# Patient Record
Sex: Male | Born: 1970 | Race: Black or African American | Hispanic: No | Marital: Single | Smoking: Never smoker
Health system: Southern US, Community
[De-identification: ages and names within clinical notes are randomized; demographics above are authoritative.]

## PROBLEM LIST (undated history)

## (undated) DIAGNOSIS — T7840XA Allergy, unspecified, initial encounter: Secondary | ICD-10-CM

## (undated) DIAGNOSIS — I10 Essential (primary) hypertension: Secondary | ICD-10-CM

## (undated) DIAGNOSIS — E119 Type 2 diabetes mellitus without complications: Secondary | ICD-10-CM

## (undated) HISTORY — PX: NO PAST SURGERIES: SHX2092

---

## 2004-01-10 ENCOUNTER — Emergency Department (HOSPITAL_COMMUNITY): Admission: EM | Admit: 2004-01-10 | Discharge: 2004-01-10 | Payer: Self-pay | Admitting: Family Medicine

## 2011-01-29 DIAGNOSIS — J302 Other seasonal allergic rhinitis: Secondary | ICD-10-CM | POA: Insufficient documentation

## 2012-12-11 ENCOUNTER — Encounter (HOSPITAL_COMMUNITY): Payer: Self-pay

## 2012-12-11 ENCOUNTER — Emergency Department (HOSPITAL_COMMUNITY)
Admission: EM | Admit: 2012-12-11 | Discharge: 2012-12-11 | Disposition: A | Discharge status: Home / Self Care | Payer: Self-pay | Attending: Emergency Medicine | Admitting: Emergency Medicine

## 2012-12-11 ENCOUNTER — Emergency Department (HOSPITAL_COMMUNITY): Payer: Self-pay

## 2012-12-11 DIAGNOSIS — S63602A Unspecified sprain of left thumb, initial encounter: Secondary | ICD-10-CM

## 2012-12-11 DIAGNOSIS — IMO0002 Reserved for concepts with insufficient information to code with codable children: Secondary | ICD-10-CM | POA: Insufficient documentation

## 2012-12-11 DIAGNOSIS — Y9389 Activity, other specified: Secondary | ICD-10-CM | POA: Insufficient documentation

## 2012-12-11 DIAGNOSIS — S6390XA Sprain of unspecified part of unspecified wrist and hand, initial encounter: Secondary | ICD-10-CM | POA: Insufficient documentation

## 2012-12-11 DIAGNOSIS — Y929 Unspecified place or not applicable: Secondary | ICD-10-CM | POA: Insufficient documentation

## 2012-12-11 HISTORY — DX: Allergy, unspecified, initial encounter: T78.40XA

## 2012-12-11 MED ORDER — HYDROCODONE-ACETAMINOPHEN 5-325 MG PO TABS: ORAL_TABLET | ORAL | Status: DC

## 2012-12-11 MED ORDER — IBUPROFEN 800 MG PO TABS: 800.0000 mg | ORAL_TABLET | Freq: Three times a day (TID) | ORAL | Status: DC

## 2012-12-11 NOTE — ED Notes (Signed)
Pt reports injuring his left thumb yesterday while weed-eating.

## 2012-12-11 NOTE — ED Notes (Signed)
Pt hit left thumb with weed eater while swatting at some bees yesterday, c/o pain mostly below thumb, swelling noted, limited ROM, ice pack provided for pt

## 2012-12-12 NOTE — ED Provider Notes (Signed)
CSN: [id number]     Arrival date & time 12/11/12  1128 History   First MD Initiated Contact with Patient 12/11/12 1146     Chief Complaint  Patient presents with  . Hand Pain   (Consider location/radiation/quality/duration/timing/severity/associated sxs/prior Treatment) Patient is a 42 y.o. male presenting with hand pain. The history is provided by the patient.  Hand Pain This is a new problem. The current episode started yesterday. The problem occurs constantly. The problem has been unchanged. Associated symptoms include arthralgias. Pertinent negatives include no chills, fever, joint swelling, neck pain, numbness, rash, vomiting or weakness. The symptoms are aggravated by bending. He has tried ice for the symptoms. The treatment provided no relief.    Past Medical History  Diagnosis Date  . Allergy    History reviewed. No pertinent past surgical history. No family history on file. History  Substance Use Topics  . Smoking status: Never Smoker   . Smokeless tobacco: Not on file  . Alcohol Use: No    Review of Systems  Constitutional: Negative for fever and chills.  HENT: Negative for neck pain.   Gastrointestinal: Negative for vomiting.  Genitourinary: Negative for dysuria and difficulty urinating.  Musculoskeletal: Positive for arthralgias. Negative for joint swelling.       Left thumb pain  Skin: Negative for color change, rash and wound.  Neurological: Negative for weakness and numbness.  All other systems reviewed and are negative.    Allergies  Review of patient's allergies indicates no known allergies.  Home Medications   Current Outpatient Rx  Name  Route  Sig  Dispense  Refill  . cetirizine (ZYRTEC) 10 MG tablet   Oral   Take 10 mg by mouth daily as needed for allergies.         . Polyvinyl Alcohol-Povidone (CLEAR EYES ALL SEASONS) 5-6 MG/ML SOLN   Ophthalmic   Apply 1 drop to eye daily as needed (Itchy Eyes).         Marland Kitchen HYDROcodone-acetaminophen  (NORCO/VICODIN) 5-325 MG per tablet      Take one-two tabs po q 4-6 hrs prn pain   12 tablet   0   . ibuprofen (ADVIL,MOTRIN) 800 MG tablet   Oral   Take 1 tablet (800 mg total) by mouth 3 (three) times daily.   21 tablet   0    BP 147/107  Pulse 99  Temp(Src) 98.7 F (37.1 C) (Oral)  Resp 20  Ht 5\' 9"  (1.753 m)  Wt 220 lb (99.791 kg)  BMI 32.47 kg/m2  SpO2 98% Physical Exam  Nursing note and vitals reviewed. Constitutional: He is oriented to person, place, and time. He appears well-developed and well-nourished. No distress.  HENT:  Head: Normocephalic and atraumatic.  Cardiovascular: Normal rate, regular rhythm and normal heart sounds.   No murmur heard. Pulmonary/Chest: Effort normal and breath sounds normal. No respiratory distress.  Musculoskeletal: He exhibits tenderness. He exhibits no edema.       Left hand: He exhibits tenderness. He exhibits normal range of motion, normal two-point discrimination, normal capillary refill, no deformity, no laceration and no swelling. Normal sensation noted. Normal strength noted.       Hands: Localized ttp of the proximal thumb and thenar eminence.    Radial pulse is brisk, distal sensation intact.  CR< 2 sec.  No bruising,edema or bony deformity.  Patient has full ROM. Wrist is NT  Neurological: He is alert and oriented to person, place, and time. He exhibits normal muscle tone.  Coordination normal.  Skin: Skin is warm and dry.    ED Course  Procedures (including critical care time) Labs Review Labs Reviewed - No data to display Imaging Review Dg Finger Thumb Left  12/11/2012   CLINICAL DATA:  Pain at 1st MCP joint, injury 1 day ago, struck hand on weed eater  EXAM: LEFT THUMB 2+V  COMPARISON:  None  FINDINGS: Tiny bony density seen at the base of the distal phalanx of the left thumb cannot exclude tiny radial margin fracture.  Joint spaces preserved.  Osseous mineralization normal.  No additional fracture, dislocation or bone  destruction.  IMPRESSION: Tiny calcific density at the radial aspect, base of distal phalanx right thumb, cannot exclude tiny fracture; correlation for pain/tenderness at this site recommended.   Electronically Signed   By: Ulyses Southward M.D.   On: 12/11/2012 12:20    MDM   1. Sprain of left thumb, initial encounter    velcro thumb spica splint applied.  Pain improved, remains NV intact.  Patient agrees to elevate, ice and referral given for orthopedics.      Elfreida Heggs L. Trisha Mangle, PA-C 12/12/12 2127

## 2012-12-14 NOTE — ED Provider Notes (Signed)
Medical screening examination/treatment/procedure(s) were performed by non-physician practitioner and as supervising physician I was immediately available for consultation/collaboration.  Lyanne Co, MD 12/14/12 [phone]

## 2013-01-08 DIAGNOSIS — E1169 Type 2 diabetes mellitus with other specified complication: Secondary | ICD-10-CM | POA: Insufficient documentation

## 2013-01-08 DIAGNOSIS — E1165 Type 2 diabetes mellitus with hyperglycemia: Secondary | ICD-10-CM | POA: Insufficient documentation

## 2018-10-22 DIAGNOSIS — R000 Tachycardia, unspecified: Secondary | ICD-10-CM | POA: Insufficient documentation

## 2018-10-22 DIAGNOSIS — N50819 Testicular pain, unspecified: Secondary | ICD-10-CM | POA: Insufficient documentation

## 2019-01-24 ENCOUNTER — Ambulatory Visit: Payer: No Typology Code available for payment source

## 2019-01-24 ENCOUNTER — Ambulatory Visit (INDEPENDENT_AMBULATORY_CARE_PROVIDER_SITE_OTHER): Payer: No Typology Code available for payment source | Admitting: Orthopedic Surgery

## 2019-01-24 ENCOUNTER — Encounter: Payer: Self-pay | Admitting: Orthopedic Surgery

## 2019-01-24 ENCOUNTER — Other Ambulatory Visit: Payer: Self-pay

## 2019-01-24 VITALS — BP 158/98 | HR 98 | Ht 69.0 in | Wt 155.0 lb

## 2019-01-24 DIAGNOSIS — M79642 Pain in left hand: Secondary | ICD-10-CM | POA: Diagnosis not present

## 2019-01-24 DIAGNOSIS — E1159 Type 2 diabetes mellitus with other circulatory complications: Secondary | ICD-10-CM | POA: Insufficient documentation

## 2019-01-24 NOTE — Patient Instructions (Signed)
Start OT    

## 2019-01-24 NOTE — Progress Notes (Signed)
Zachary Mayo  01/24/2019  Body mass index is 22.89 kg/m.   HISTORY SECTION :  Chief Complaint  Patient presents with  . Hand Pain    left / injury one week ago after lifting well cap    48 1 week ago dropped a well cover on his fingers he had a dislocation of his ring and long finger on his left hand he popped it back in place but after a week he noticed that he was still having trouble bending his fingers comes in with only mild discomfort PIP joint left ring and long finger with decreased range of motion minimal swelling   Review of Systems  All other systems reviewed and are negative.    has a past medical history of Allergy.   History reviewed. No pertinent surgical history.  Body mass index is 22.89 kg/m.   No Known Allergies   Current Outpatient Medications:  .  atenolol (TENORMIN) 25 MG tablet, Take by mouth., Disp: , Rfl:  .  Insulin Glargine, 1 Unit Dial, (TOUJEO SOLOSTAR) 300 UNIT/ML SOPN, Inject into the skin., Disp: , Rfl:  .  metFORMIN (GLUCOPHAGE-XR) 500 MG 24 hr tablet, Take by mouth., Disp: , Rfl:  .  cetirizine (ZYRTEC) 10 MG tablet, Take 10 mg by mouth daily as needed for allergies., Disp: , Rfl:    PHYSICAL EXAM SECTION: 1) BP (!) 158/98   Pulse 98   Ht 5\' 9"  (1.753 m)   Wt 155 lb (70.3 kg)   BMI 22.89 kg/m   Body mass index is 22.89 kg/m. General appearance: Well-developed well-nourished no gross deformities  2) Cardiovascular normal pulse and perfusion in the upper extremities normal color without edema  3) Neurologically deep tendon reflexes are equal and normal, no sensation loss or deficits no pathologic reflexes  4) Psychological: Awake alert and oriented x3 mood and affect normal  5) Skin no lacerations or ulcerations no nodularity no palpable masses, no erythema or nodularity  6) Musculoskeletal:   Right hand looks normal  Left hand both joints are aligned normally he has tenderness at the PIP joints of the ring and long finger  he cannot really bend him very much but his flexor tendon function is intact at the DIP and PIP joint both joints are stable and the skin is intact   MEDICAL DECISION SECTION:  Encounter Diagnosis  Name Primary?  . Left hand pain Yes    Imaging X-rays were taken in the office we did not see a fracture  Plan:  (Rx., Inj., surg., Frx, MRI/CT, XR:2)  Active range of motion active assisted range of motion and physical/Occupational Therapy  Follow-up 4 to 6 weeks  10:08 AM Arther Abbott, MD  01/24/2019

## 2019-01-31 ENCOUNTER — Other Ambulatory Visit: Payer: Self-pay

## 2019-01-31 ENCOUNTER — Ambulatory Visit (HOSPITAL_COMMUNITY): Payer: PRIVATE HEALTH INSURANCE | Attending: Orthopedic Surgery

## 2019-01-31 DIAGNOSIS — M25542 Pain in joints of left hand: Secondary | ICD-10-CM | POA: Insufficient documentation

## 2019-01-31 DIAGNOSIS — M25642 Stiffness of left hand, not elsewhere classified: Secondary | ICD-10-CM | POA: Insufficient documentation

## 2019-01-31 DIAGNOSIS — R29898 Other symptoms and signs involving the musculoskeletal system: Secondary | ICD-10-CM | POA: Diagnosis present

## 2019-01-31 NOTE — Patient Instructions (Signed)
AROM: DIP Flexion / Extension   Pinch middle knuckle of ___middle and finger_____ finger of right hand to prevent bending. Bend end knuckle until stretch is felt. Hold _5___ seconds. Relax. Straighten finger as far as possible. Repeat _10___ times per set. Do __1__ sets per session. Do __2-3__ sessions per day.  Copyright  VHI. All rights reserved.    AROM: PIP Flexion / Extension   Pinch bottom knuckle of ___middle and finger_____ finger of right hand to prevent bending. Actively bend middle knuckle until stretch is felt. Hold ___5_ seconds. Relax. Straighten finger as far as possible. Repeat _10___ times per set. Do __1__ sets per session. Do __2-3__ sessions per day.  Copyright  VHI. All rights reserved.   AROM: Finger Flexion / Extension   Actively bend fingers of right hand. Start with knuckles furthest from palm, and slowly make a fist. Hold _5___ seconds. Relax. Then straighten fingers as far as possible. Repeat _10__ times per set. Do __1__ sets per session. Do __2-3_ sessions per day.   Tendon Gliding Exercises: Complete 10X, hold for 3-5 seconds, 1-2x per day  1) Straight: begin with wrist in extended position and fingers straight      2) Hook: Bend your fingers making them look like a hook while keeping your thumb straight.      3) Fist: Make your hand into a fist.      4) Table Top: Straighten your fingers straight out making them look like a table top.      5) Straight Fist: Bend your fingers straight down into a straight fist.    Home Exercises Program Theraputty Exercises  Do the following exercises 1 times a day using your affected hand.    1. Roll putty into a roll.  2. Pinch along log with first finger and thumb.   3. Roll it back into a log.   4. Pinch using thumb and side of first finger.  5. Roll into a ball, then flatten into a pancake.  6. Using your fingers, make putty into a mountain.  7. Roll the putty into a ball;  squeeze/release 10-15 times.

## 2019-02-03 NOTE — Therapy (Signed)
[facility] [address] [place], [place], [address] Phone: [phone]   Fax:  [phone]  Occupational Therapy Evaluation  Patient Details  Name: Zachary Mayo MRN: [id number] Date of Birth: [birth year] Referring Provider (OT): Fuller Canada, MD   Encounter Date: 01/31/2019  OT End of Session - 02/03/19 1339    Visit Number  1    Number of Visits  1    Authorization Type  [employer]    Authorization Time Period  no visit limit    OT Start Time  1030    OT Stop Time  1102    OT Time Calculation (min)  32 min    Activity Tolerance  Patient tolerated treatment well    Behavior During Therapy  [facility] for tasks assessed/performed       Past Medical History:  Diagnosis Date  . Allergy     No past surgical history on file.  There were no vitals filed for this visit.        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   Mobility  Mobility Status Independent  Written Expression  Dominant Hand Right  Vision - History  Baseline Vision No visual deficits  Cognition  Overall Cognitive Status Within Functional Limits for tasks assessed  Observation/Other Assessments  Focus on Therapeutic Outcomes (FOTO)  N/A  Coordination  9 Hole Peg Test  Right;Left  Right 9 Hole Peg Test 20.4"  Left 9 Hole Peg Test 23.4"  Edema  Edema Right hand between PIP and DIP jointL 15.5 cm. left: 15.75 cm  ROM / Strength  AROM / PROM / Strength AROM;PROM;Strength  Strength  Strength Assessment Site Hand  Right/Left hand Right;Left  Right Hand Grip (lbs) 110  Right Hand Lateral Pinch 20 lbs  Right Hand 3 Point Pinch 19 lbs  Left Hand Grip (lbs) 48  Left Hand Lateral Pinch 20 lbs  Left Hand 3 Point Pinch 18 lbs  Left Hand AROM  L Long  MCP 0-90 90 Degrees  L Long PIP 0-100 92 Degrees  L Long DIP 0-70 56 Degrees  L Ring  MCP 0-90 84 Degrees  L Ring PIP 0-100 82 Degrees  L Ring DIP 0-70 40 Degrees                   OT Education - 02/03/19 1335    Education Details  theraputty for grip and pinch strengthening (yellow and red), tendon gliding, A/ROM for the hand.    Person(s) Educated  Patient    Methods  Explanation;Demonstration;Verbal cues;Handout    Comprehension  Returned demonstration;Verbalized understanding       OT Short Term Goals - 02/03/19 1355      OT SHORT TERM GOAL #1   Title  Patient will be educated and independent with HEP in order to return to  using his left hand has his non dominant for all daily and work related tasks with less difficulty and increased comfort.    Time  1    Period  Days    Status  Achieved    Target Date  01/31/19               Plan - 02/03/19 1352    Clinical Impression Statement  A: Patient is a 48 y/o male S/P left hand ring and long finger dislocation causing increased pain with gripping, edema, and decreased strength and ROM resulting in some difficulty utilizing his left hand as his non dominant extremity for all daily tasks. Pt reports that since he saw the MD, he has been actively working on stretching his finger into flexion and his ROM as increased. His deficits are minimal enough that he feels comfortable completing a HEP at home to focus on the mentioned deficits.  Reviewed HEP and all recommendations for edema reduction and pain management. Pt is in agreement with plan.    OT Occupational Profile and History  Problem Focused Assessment - Including review of records relating to presenting problem    Occupational performance deficits (Please refer to evaluation for details):  ADL's    Rehab Potential  Excellent    Clinical Decision Making  Limited treatment options, no task modification necessary    Comorbidities Affecting Occupational Performance:  None    Modification or Assistance to Complete Evaluation   No modification of tasks or assist necessary to complete eval    OT Frequency  One time visit    OT Treatment/Interventions  Patient/family education    Plan  P: One time visit to establish HEP. Pt to follow up with MD.    Consulted and Agree with Plan of Care  Patient       Patient will benefit from skilled therapeutic intervention in order to improve the following deficits and impairments:           Visit Diagnosis: Stiffness of left hand, not elsewhere classified - Plan: Ot plan of care cert/re-cert  Pain in joint of left hand - Plan: Ot plan of care cert/re-cert  Other symptoms and signs involving the musculoskeletal system - Plan: Ot plan of care cert/re-cert    Problem List Patient Active Problem List   Diagnosis Date Noted  . Hypertension associated with diabetes ([place]) 01/24/2019  . Tachycardia 10/22/2018  . Testicular pain 10/22/2018  . Dyslipidemia associated with type 2 diabetes mellitus ([place]) 01/08/2013  . Uncontrolled type 2 diabetes mellitus with hyperglycemia, with long-term current use of insulin ([place]) 01/08/2013  . Seasonal allergies 01/29/2011   Ailene Ravel, OTR/L,CBIS  [phone]  02/03/2019, 1:58 PM  [facility] [address] [place], [place], [address] Phone: [phone]   Fax:  [phone]  Name: Zachary Mayo MRN: [id number] Date of Birth: 11/16/1970

## 2019-02-11 ENCOUNTER — Encounter (HOSPITAL_COMMUNITY): Payer: No Typology Code available for payment source

## 2019-02-13 ENCOUNTER — Encounter (HOSPITAL_COMMUNITY): Payer: No Typology Code available for payment source

## 2019-02-14 ENCOUNTER — Other Ambulatory Visit: Payer: Self-pay

## 2019-02-14 ENCOUNTER — Ambulatory Visit (INDEPENDENT_AMBULATORY_CARE_PROVIDER_SITE_OTHER): Payer: No Typology Code available for payment source | Admitting: Orthopedic Surgery

## 2019-02-14 ENCOUNTER — Encounter: Payer: Self-pay | Admitting: Orthopedic Surgery

## 2019-02-14 VITALS — BP 123/87 | HR 104 | Temp 97.4°F | Ht 69.0 in | Wt 154.4 lb

## 2019-02-14 DIAGNOSIS — S63289D Dislocation of proximal interphalangeal joint of unspecified finger, subsequent encounter: Secondary | ICD-10-CM | POA: Diagnosis not present

## 2019-02-14 NOTE — Progress Notes (Signed)
Chief Complaint  Patient presents with  . Hand Pain    L/ring finger is alittle better, stiff at times, some soreness but can more it alittle more now.    48 year old male injured his left ring and long finger dropping a well cover on them dislocating them but he relocated them and then came in for follow-up.  Injury date was November 6  He is regained almost all of his motion has some swelling of the PIP joint and a little flexion contracture there but is passively correctable  He is advised to continue his exercises work on extension expect some swelling at the PIP joint for up to 6 months  Follow-up if he continues to have any problems  Encounter Diagnosis  Name Primary?  . Dislocation of proximal interphalangeal joint of finger, subsequent encounter Yes

## 2019-02-17 ENCOUNTER — Encounter (HOSPITAL_COMMUNITY): Payer: No Typology Code available for payment source

## 2019-02-20 ENCOUNTER — Encounter (HOSPITAL_COMMUNITY): Payer: No Typology Code available for payment source

## 2019-02-25 ENCOUNTER — Encounter (HOSPITAL_COMMUNITY): Payer: No Typology Code available for payment source

## 2019-02-27 ENCOUNTER — Encounter (HOSPITAL_COMMUNITY): Payer: No Typology Code available for payment source

## 2019-03-03 ENCOUNTER — Encounter (HOSPITAL_COMMUNITY): Payer: No Typology Code available for payment source | Admitting: Occupational Therapy

## 2019-03-05 ENCOUNTER — Encounter (HOSPITAL_COMMUNITY): Payer: No Typology Code available for payment source | Admitting: Occupational Therapy

## 2019-03-11 ENCOUNTER — Encounter (HOSPITAL_COMMUNITY): Payer: No Typology Code available for payment source

## 2019-03-13 ENCOUNTER — Encounter (HOSPITAL_COMMUNITY): Payer: No Typology Code available for payment source | Admitting: Occupational Therapy

## 2019-09-09 ENCOUNTER — Ambulatory Visit
Admission: EM | Admit: 2019-09-09 | Discharge: 2019-09-09 | Disposition: A | Discharge status: Home / Self Care | Payer: PRIVATE HEALTH INSURANCE | Attending: Emergency Medicine | Admitting: Emergency Medicine

## 2019-09-09 DIAGNOSIS — R000 Tachycardia, unspecified: Secondary | ICD-10-CM | POA: Diagnosis not present

## 2019-09-09 DIAGNOSIS — S0501XA Injury of conjunctiva and corneal abrasion without foreign body, right eye, initial encounter: Secondary | ICD-10-CM

## 2019-09-09 DIAGNOSIS — R030 Elevated blood-pressure reading, without diagnosis of hypertension: Secondary | ICD-10-CM

## 2019-09-09 HISTORY — DX: Type 2 diabetes mellitus without complications: E11.9

## 2019-09-09 MED ORDER — POLYMYXIN B-TRIMETHOPRIM 10000-0.1 UNIT/ML-% OP SOLN: 1.0000 [drp] | Freq: Four times a day (QID) | OPHTHALMIC | 0 refills | Status: DC

## 2019-09-09 MED ORDER — POLYMYXIN B-TRIMETHOPRIM 10000-0.1 UNIT/ML-% OP SOLN: 1.0000 [drp] | Freq: Four times a day (QID) | OPHTHALMIC | 0 refills | Status: AC

## 2019-09-09 NOTE — ED Provider Notes (Signed)
Zachary Mayo    CSN: [id number] Arrival date & time: 09/09/19  1324      History   Chief Complaint Chief Complaint  Patient presents with  . Eye Problem    HPI Zachary Mayo is a 49 y.o. male.   Patient presents with right eye tearing, irritation, and mild itching since 09/05/2019.  He states the symptoms began after he got his haircut and his barber brushed his face to remove the hair; he thinks the brush hit his eye.  He denies acute eye pain or changes in his vision.  He denies fever, chills, or other symptoms.  Patient states his heart rate is elevated when he goes to the doctor.  He denies chest pain, shortness of breath, weakness, numbness, headache, dizziness or other symptoms.  The history is provided by the patient.    Past Medical History:  Diagnosis Date  . Allergy   . Diabetes mellitus without complication (HCC)    type 2    Patient Active Problem List   Diagnosis Date Noted  . Hypertension associated with diabetes (H[facility]) 01/24/2019  . Tachycardia 10/22/2018  . Testicular pain 10/22/2018  . Dyslipidemia associated with type 2 diabetes mellitus (H[facility]) 01/08/2013  . Uncontrolled type 2 diabetes mellitus with hyperglycemia, with long-term current use of insulin (H[facility]) 01/08/2013  . Seasonal allergies 01/29/2011    Past Surgical History:  Procedure Laterality Date  . NO PAST SURGERIES         Home Medications    Prior to Admission medications   Medication Sig Start Date End Date Taking? Authorizing Provider  atenolol (TENORMIN) 25 MG tablet Take by mouth. 01/17/19   [provider]  cetirizine (ZYRTEC) 10 MG tablet Take 10 mg by mouth daily as needed for allergies.    [provider]  Insulin Glargine, 1 Unit Dial, (TOUJEO SOLOSTAR) 300 UNIT/ML SOPN Inject into the skin. 10/16/18   [provider]  metFORMIN (GLUCOPHAGE-XR) 500 MG 24 hr tablet Take by mouth. 10/06/18   [provider]  trimethoprim-polymyxin b  (POLYTRIM) ophthalmic solution Place 1 drop into both eyes 4 (four) times daily for 7 days. 09/09/19 09/16/19  Mickie Bail, NP    Family History Family History  Problem Relation Age of Onset  . Diabetes Mother   . Diabetes Father   . Diabetes Maternal Grandmother   . Diabetes Maternal Grandfather   . Diabetes Paternal Grandmother   . Diabetes Paternal Grandfather     Social History Social History   Tobacco Use  . Smoking status: Never Smoker  . Smokeless tobacco: Never Used  Vaping Use  . Vaping Use: Never used  Substance Use Topics  . Alcohol use: No  . Drug use: No     Allergies   Patient has no known allergies.   Review of Systems Review of Systems  Constitutional: Negative for chills and fever.  HENT: Negative for ear pain and sore throat.   Eyes: Positive for discharge, redness and itching. Negative for pain and visual disturbance.  Respiratory: Negative for cough and shortness of breath.   Cardiovascular: Negative for chest pain and palpitations.  Gastrointestinal: Negative for abdominal pain and vomiting.  Genitourinary: Negative for dysuria and hematuria.  Musculoskeletal: Negative for arthralgias and back pain.  Skin: Negative for color change and rash.  Neurological: Negative for seizures and syncope.  All other systems reviewed and are negative.    Physical Exam Triage Vital Signs ED Triage Vitals [09/09/19 1325]  Enc  Vitals Group     BP      Pulse      Resp      Temp      Temp src      SpO2      Weight      Height      Head Circumference      Peak Flow      Pain Score 0     Pain Loc      Pain Edu?      Excl. in GC?    No data found.  Updated Vital Signs BP (!) 139/96   Pulse (!) 142   Temp 99.5 F (37.5 C)   Resp 14   SpO2 98%   Visual Acuity Right Eye Distance: 20/20 Left Eye Distance: 20/20 Bilateral Distance: 20/30  Right Eye Near:   Left Eye Near:    Bilateral Near:     Physical Exam Vitals and nursing note  reviewed.  Constitutional:      General: He is not in acute distress.    Appearance: He is well-developed. He is not ill-appearing.  HENT:     Head: Normocephalic and atraumatic.     Mouth/Throat:     Mouth: Mucous membranes are moist.  Eyes:     General: Lids are normal. Vision grossly intact. No visual field deficit.    Extraocular Movements: Extraocular movements intact.     Conjunctiva/sclera:     Right eye: Right conjunctiva is injected.     Left eye: Left conjunctiva is not injected.     Pupils: Pupils are equal, round, and reactive to light.     Right eye: Fluorescein uptake present.   Cardiovascular:     Rate and Rhythm: Normal rate and regular rhythm.     Heart sounds: Normal heart sounds. No murmur heard.   Pulmonary:     Effort: Pulmonary effort is normal. No respiratory distress.     Breath sounds: Normal breath sounds. No wheezing or rhonchi.  Abdominal:     Palpations: Abdomen is soft.     Tenderness: There is no abdominal tenderness.  Musculoskeletal:     Cervical back: Neck supple.     Right lower leg: No edema.     Left lower leg: No edema.  Skin:    General: Skin is warm and dry.     Findings: No rash.  Neurological:     General: No focal deficit present.     Mental Status: He is alert and oriented to person, place, and time.     Gait: Gait normal.  Psychiatric:        Mood and Affect: Mood normal.        Behavior: Behavior normal.      UC Treatments / Results  Labs (all labs ordered are listed, but only abnormal results are displayed) Labs Reviewed - No data to display  EKG   Radiology No results found.  Procedures Procedures (including critical care time)  Medications Ordered in UC Medications - No data to display  Initial Impression / Assessment and Plan / UC Course  I have reviewed the triage vital signs and the nursing notes.  Pertinent labs & imaging results that were available during my care of the patient were reviewed by me  and considered in my medical decision making (see chart for details).   Right corneal abrasion.  Tachycardia, elevated blood pressure with known hypertension.  Treating with Polytrim eyedrops.  Instructed patient to follow-up with  his eye doctor in 1 to 2 days for recheck.  Instructed him to go to the emergency department if he has acute eye pain or changes in his vision.  Discussed with patient that his heart rate and blood pressure are elevated today.  Instructed him to go to the emergency department if he has chest pain, shortness of breath, weakness, or other concerning symptoms.  Instructed him to follow-up with his PCP in 2 to 4 weeks for a recheck.  Patient agrees to plan of care.      Final Clinical Impressions(s) / UC Diagnoses   Final diagnoses:  Abrasion of right cornea, initial encounter  Tachycardia  Elevated blood pressure reading     Discharge Instructions     Use the antibiotic eyedrops as prescribed.    Follow-up with your eye doctor for a recheck in 1 to 2 days if your symptoms are not improving.    Go to the emergency department if you have acute eye pain or changes in your vision.    Your blood pressure is elevated today at 139/96; heart rate 138.  Please have this rechecked by your primary care provider in 2-4 weeks.            ED Prescriptions    Medication Sig Dispense Auth. Provider   trimethoprim-polymyxin b (POLYTRIM) ophthalmic solution  (Status: Discontinued) Place 1 drop into both eyes 4 (four) times daily for 7 days. 10 mL Mickie Bail, NP   trimethoprim-polymyxin b (POLYTRIM) ophthalmic solution Place 1 drop into both eyes 4 (four) times daily for 7 days. 10 mL Mickie Bail, NP     PDMP not reviewed this encounter.   Mickie Bail, NP 09/09/19 [phone]

## 2019-09-09 NOTE — Discharge Instructions (Addendum)
Use the antibiotic eyedrops as prescribed.    Follow-up with your eye doctor for a recheck in 1 to 2 days if your symptoms are not improving.    Go to the emergency department if you have acute eye pain or changes in your vision.    Your blood pressure is elevated today at 139/96; heart rate 138.  Please have this rechecked by your primary care provider in 2-4 weeks.

## 2019-09-09 NOTE — ED Triage Notes (Signed)
Patient complains of right eye irritation since Friday. Denies sensitivity to light, pain, vision changes or disturbances but endorses excessive tear production.

## 2019-12-25 ENCOUNTER — Other Ambulatory Visit: Payer: Self-pay

## 2019-12-25 ENCOUNTER — Encounter: Payer: Self-pay | Admitting: Family Medicine

## 2019-12-25 ENCOUNTER — Ambulatory Visit: Payer: Self-pay

## 2019-12-25 ENCOUNTER — Ambulatory Visit (INDEPENDENT_AMBULATORY_CARE_PROVIDER_SITE_OTHER): Payer: No Typology Code available for payment source | Admitting: Family Medicine

## 2019-12-25 DIAGNOSIS — M25561 Pain in right knee: Secondary | ICD-10-CM | POA: Diagnosis not present

## 2019-12-25 MED ORDER — DICLOFENAC SODIUM 1 % EX GEL: 4.0000 g | Freq: Four times a day (QID) | CUTANEOUS | 6 refills | Status: DC | PRN

## 2019-12-25 MED ORDER — MELOXICAM 15 MG PO TABS: 7.5000 mg | ORAL_TABLET | Freq: Every day | ORAL | 6 refills | Status: DC | PRN

## 2019-12-25 NOTE — Progress Notes (Signed)
Office Visit Note   Patient: Zachary Mayo           Date of Birth: 06-26-1970           MRN: [id number] Visit Date: 12/25/2019 Requested by: Wilfred Curtis, MD [address] [place],  [place] [address] PCP: Wilfred Curtis, MD  Subjective: Chief Complaint  Patient presents with  . Right Knee - Pain    Knee popped while casually walking in the mall 12/20/19 - pain/tightness all around the patella since then. Has not noticed swelling. Hurts to straighten or bend knee much. H/o injury 20-30 years ago to that knee.    HPI: He is here with right knee pain.  On October 9 he was walking in the mall straight ahead, and suddenly his knee popped.  It was audible and extremely painful.  He has had some pain and swelling below the kneecap since then, and a sensation of tightness.  He has difficulty fully extending the knee and flexing it.  It has not locked.  He has a history of a knee injury 20 or 30 years ago that healed without trouble, it was a contusion to the knee while sliding into a base.  He has never had pain quite like this.  He has tried over-the-counter NSAIDs with minimal improvement.              ROS: No fevers or chills.  All other systems were reviewed and are negative.  Objective: Vital Signs: There were no vitals taken for this visit.  Physical Exam:  General:  Alert and oriented, in no acute distress. Pulm:  Breathing unlabored. Psy:  Normal mood, congruent affect. Skin: No erythema Right knee: No joint effusion.  He has no patellofemoral crepitus, but is unable to fully extend his knee today because of pain.  He has some pain with patellar apprehension and he is very tender over the distal medial patella and proximal patellar tendon, especially in the fat pad behind the tendon.  No laxity with varus or valgus stress and no pain.  There is not a lot of medial joint line tenderness, and no palpable click with McMurray's.  Lachman's feels solid.  Imaging: XR Knee 1-2 Views  Right  Result Date: 12/25/2019 X-rays of the right knee reveal well-preserved joint space, no sign of loose body or fracture.  No acute abnormality.   Assessment & Plan: 1.  Acute right knee pain, possibilities include contusion of Hoffa's fat pad, or patella subluxation. -We will try a PSO brace, Voltaren gel topically and meloxicam by mouth.  If symptoms are not improving in a couple weeks, then either physical therapy or MRI scan.     Procedures: No procedures performed  No notes on file     PMFS History: Patient Active Problem List   Diagnosis Date Noted  . Hypertension associated with diabetes (HCC) 01/24/2019  . Tachycardia 10/22/2018  . Testicular pain 10/22/2018  . Dyslipidemia associated with type 2 diabetes mellitus (HCC) 01/08/2013  . Uncontrolled type 2 diabetes mellitus with hyperglycemia, with long-term current use of insulin (HCC) 01/08/2013  . Seasonal allergies 01/29/2011   Past Medical History:  Diagnosis Date  . Allergy   . Diabetes mellitus without complication (HCC)    type 2    Family History  Problem Relation Age of Onset  . Diabetes Mother   . Diabetes Father   . Diabetes Maternal Grandmother   . Diabetes Maternal Grandfather   . Diabetes Paternal Grandmother   .  Diabetes Paternal Grandfather     Past Surgical History:  Procedure Laterality Date  . NO PAST SURGERIES     Social History   Occupational History  . Not on file  Tobacco Use  . Smoking status: Never Smoker  . Smokeless tobacco: Never Used  Vaping Use  . Vaping Use: Never used  Substance and Sexual Activity  . Alcohol use: No  . Drug use: No  . Sexual activity: Never

## 2019-12-25 NOTE — Patient Instructions (Signed)
    Infrapatellar fat pad irritation.  Possible patella subluxation.  Do leg raises to keep strong.  Do partial knee bends to prevent stiffness.    Brace during activity if it feels better to wear it.  Voltaren gel topically; meloxicam by mouth.  Call for either physical therapy referral or for MRI scan in 2-3 weeks if not improving.

## 2020-01-01 ENCOUNTER — Telehealth: Payer: Self-pay | Admitting: *Deleted

## 2020-01-01 DIAGNOSIS — M25561 Pain in right knee: Secondary | ICD-10-CM

## 2020-01-01 NOTE — Telephone Encounter (Signed)
Pt called stating he was in office couple weeks ago and was under the impression he was going to get MRI scheduled, but hasnt heard from anyone. I looked up the plan in last ov notes and saw that if he was not any better we can try PT or MRI. Pt states he wants to go ahead and proceed with the MRI. PLease advise

## 2020-01-02 NOTE — Telephone Encounter (Signed)
MRI ordered.  Sometimes insurance doesn't approve without doing more conservative treatment.

## 2020-01-02 NOTE — Telephone Encounter (Signed)
Knee still hurting.  Will order MRI.

## 2020-01-24 ENCOUNTER — Other Ambulatory Visit: Payer: Self-pay

## 2020-01-24 ENCOUNTER — Ambulatory Visit
Admission: RE | Admit: 2020-01-24 | Discharge: 2020-01-24 | Disposition: A | Discharge status: Home / Self Care | Payer: No Typology Code available for payment source | Source: Ambulatory Visit | Attending: Family Medicine | Admitting: Family Medicine

## 2020-01-24 DIAGNOSIS — M25561 Pain in right knee: Secondary | ICD-10-CM

## 2020-01-26 ENCOUNTER — Telehealth: Payer: Self-pay | Admitting: Family Medicine

## 2020-01-26 DIAGNOSIS — M25561 Pain in right knee: Secondary | ICD-10-CM

## 2020-01-26 NOTE — Telephone Encounter (Signed)
MRI looks good, no sign of ligament or cartilage injury.  I will request referral to physical therapy to [place] location. 

## 2020-01-26 NOTE — Telephone Encounter (Signed)
MRI looks good, no sign of ligament or cartilage injury.  I will request referral to physical therapy to [place] location.

## 2020-02-12 ENCOUNTER — Other Ambulatory Visit: Payer: Self-pay

## 2020-02-12 ENCOUNTER — Ambulatory Visit (HOSPITAL_COMMUNITY): Payer: PRIVATE HEALTH INSURANCE | Attending: Family Medicine | Admitting: Physical Therapy

## 2020-02-12 ENCOUNTER — Encounter (HOSPITAL_COMMUNITY): Payer: Self-pay | Admitting: Physical Therapy

## 2020-02-12 DIAGNOSIS — R2689 Other abnormalities of gait and mobility: Secondary | ICD-10-CM | POA: Insufficient documentation

## 2020-02-12 DIAGNOSIS — M25561 Pain in right knee: Secondary | ICD-10-CM | POA: Insufficient documentation

## 2020-02-12 NOTE — Therapy (Signed)
[facility] [address] [place], [place], [address] Phone: [phone]   Fax:  [phone]  Physical Therapy Evaluation  Patient Details  Name: Zachary Mayo MRN: [id number] Date of Birth: 1971/02/18 Referring Provider (PT): Lavada Mesi MD    Encounter Date: 02/12/2020   PT End of Session - 02/12/20 0955    Visit Number 1    Number of Visits 8    Date for PT Re-Evaluation 03/12/20    Authorization Type [employer]/ Generic commercial secondary    PT Start Time 0900    PT Stop Time 0950    PT Time Calculation (min) 50 min    Activity Tolerance Patient tolerated treatment well    Behavior During Therapy [facility] for tasks assessed/performed           Past Medical History:  Diagnosis Date  . Allergy   . Diabetes mellitus without complication (HCC)    type 2    Past Surgical History:  Procedure Laterality Date  . NO PAST SURGERIES      There were no vitals filed for this visit.    Subjective Assessment - 02/12/20 0910    Subjective Patient presents to physical therapy with complaint of RT knee pain. Reports history of recent subluxation about 1 month ago/ Says he was at the mall when he tried to take a step to turn his RT patella popped out to the RT. He says he was able to reduce it by taking another step with his hands around his patella. Says he thinks he pinched a nerve. Was given knee stability brace. Had MRI, which showed nothing. Still having RT knee pain. Says it is not as bad as before. Says he has not been wearing brace. Denies taking medication for this.    Limitations Lifting;Standing;Walking    Diagnostic tests MRI    Patient Stated Goals Get back to normal, get back to running    Currently in Pain? Yes    Pain Score 4     Pain Location Knee    Pain Orientation Right    Pain Descriptors / Indicators Aching    Pain Type Acute pain    Pain Onset More than a month ago    Pain Frequency Constant    Aggravating  Factors  standing for a while, bending    Pain Relieving Factors rest    Effect of Pain on Daily Activities limits              [facility] PT Assessment - 02/12/20 0001      Assessment   Medical Diagnosis RT knee pain     Referring Provider (PT) Lavada Mesi MD     Onset Date/Surgical Date --   November 2021   Next MD Visit --   none scheduled    Prior Therapy Had OT for finger       Precautions   Precautions None      Restrictions   Weight Bearing Restrictions No      Balance Screen   Has the patient fallen in the past 6 months No      Home Environment   Living Environment Private residence      Prior Function   Level of Independence Independent      Cognition   Overall Cognitive Status Within Functional Limits for tasks assessed      Observation/Other Assessments   Focus on Therapeutic Outcomes (FOTO)  44% limited  Functional Tests   Functional tests Squat;Single leg stance      Squat   Comments Slight weight shift toward LT       Single Leg Stance   Comments able to hold 30 sec bilateral on solid floor but with decreased instability on RT       ROM / Strength   AROM / PROM / Strength AROM;Strength      AROM   AROM Assessment Site Knee    Right/Left Knee Right;Left    Right Knee Extension 10    Right Knee Flexion 110    Left Knee Extension 0    Left Knee Flexion 135      Strength   Strength Assessment Site Hip;Knee;Ankle    Right/Left Hip Right;Left    Right Hip Flexion 4/5    Right Hip Extension 4+/5    Right Hip ABduction 4-/5    Left Hip Flexion 4/5    Left Hip Extension 4+/5    Left Hip ABduction 4+/5    Right/Left Knee Right;Left    Right Knee Flexion 4/5   pain   Right Knee Extension 4-/5   pain   Left Knee Flexion 5/5    Left Knee Extension 5/5      Flexibility   Soft Tissue Assessment /Muscle Length --   Mod restriction in RT quad length      Palpation   Palpation comment Mod TTP about patellar tendon and medial joint line of RT  knee       Ambulation/Gait   Ambulation/Gait Yes    Ambulation/Gait Assistance 7: Independent    Assistive device None    Gait Pattern Decreased stride length;Decreased weight shift to right;Antalgic    Ambulation Surface Level;Indoor                      Objective measurements completed on examination: See above findings.       [facility] Adult PT Treatment/Exercise - 02/12/20 0001      Exercises   Exercises Knee/Hip      Knee/Hip Exercises: Supine   Quad Sets Right;10 reps    Straight Leg Raises Right;10 reps      Knee/Hip Exercises: Sidelying   Hip ABduction Right;10 reps                  PT Education - 02/12/20 0922    Education Details on evaluation findings, POC and HEP    Person(s) Educated Patient    Methods Explanation;Handout    Comprehension Verbalized understanding            PT Short Term Goals - 02/12/20 1021      PT SHORT TERM GOAL #1   Title Patient will be independent with initial HEP and self-management strategies to improve functional outcomes    Time 2    Period Weeks    Status New    Target Date 02/27/20             PT Long Term Goals - 02/12/20 1021      PT LONG TERM GOAL #1   Title Patient will improve FOTO score by 15% to indicate improvement in functional outcomes    Time 4    Period Weeks    Status New    Target Date 03/12/20      PT LONG TERM GOAL #2   Title Patient will report at least 75% overall improvement in subjective complaint to indicate improvement in ability to perform ADLs.  Time 4    Period Weeks    Status New    Target Date 03/12/20      PT LONG TERM GOAL #3   Title Patient will have RT knee AROM  at least 0-120 degrees to improve functional mobility and facilitate squatting to pick up items from floor.    Time 4    Period Weeks    Status New    Target Date 03/12/20      PT LONG TERM GOAL #4   Title Patient will be able to return to running/ jogging up to 1 mile with no signfiicant  increase in knee pain to return to PLOF    Time 4    Period Weeks    Status New    Target Date 03/12/20                  Plan - 02/12/20 1018    Clinical Impression Statement Patient is a 49 y.o. male who presents to physical therapy with complaint of RT knee pain. Patient demonstrates decreased strength, ROM restriction, balance deficits and gait abnormalities which are likely contributing to symptoms of pain and are negatively impacting patient ability to perform ADLs and functional mobility tasks. Patient will benefit from skilled physical therapy services to address these deficits to reduce pain and improve level of function with ADLs and functional mobility tasks.    Personal Factors and Comorbidities Comorbidity 1    Comorbidities DM    Examination-Activity Limitations Locomotion Level;Stand;Stairs;Squat;Transfers    Examination-Participation Restrictions Occupation;Community Activity;Yard Work;Cleaning    Stability/Clinical Decision Making Stable/Uncomplicated    Clinical Decision Making Low    Rehab Potential Good    PT Frequency 2x / week    PT Duration 4 weeks    PT Treatment/Interventions ADLs/Self Care Home Management;Aquatic Therapy;Biofeedback;Cryotherapy;Electrical Stimulation;Moist Heat;Traction;Balance training;Therapeutic exercise;Manual techniques;Orthotic Fit/Training;Stair training;Functional mobility training;Therapeutic activities;Iontophoresis 4mg /ml Dexamethasone;Manual lymph drainage;Vestibular;Vasopneumatic Device;Splinting;Taping;Energy conservation;Dry needling;Patient/family education;DME Instruction;Gait training;Scar mobilization;Compression bandaging;Neuromuscular re-education;Ultrasound;Parrafin;Fluidtherapy;Contrast Bath;Visual/perceptual remediation/compensation;Spinal Manipulations;Joint Manipulations;Passive range of motion    PT Next Visit Plan Review goals and HEP. Progress RT knee strength and mobility as tolerated. Progress strength>ROM>  functional strength>balance>proprioception>plyometrics. Manuals PRN. Add heel slides and bridge next visit    PT Home Exercise Plan Eval: quad set, SLR, sidlying hip abduction    Consulted and Agree with Plan of Care Patient           Patient will benefit from skilled therapeutic intervention in order to improve the following deficits and impairments:  Abnormal gait, Hypomobility, Decreased activity tolerance, Decreased strength, Pain, Increased fascial restricitons, Decreased balance, Difficulty walking, Decreased mobility, Decreased range of motion, Improper body mechanics, Impaired flexibility  Visit Diagnosis: Right knee pain, unspecified chronicity  Other abnormalities of gait and mobility     Problem List Patient Active Problem List   Diagnosis Date Noted  . Hypertension associated with diabetes (HCC) 01/24/2019  . Tachycardia 10/22/2018  . Testicular pain 10/22/2018  . Dyslipidemia associated with type 2 diabetes mellitus (HCC) 01/08/2013  . Uncontrolled type 2 diabetes mellitus with hyperglycemia, with long-term current use of insulin (HCC) 01/08/2013  . Seasonal allergies 01/29/2011    10:26 AM, 02/12/20 14/02/21 PT DPT  Physical Therapist with [facility]  [facility]  [phone]   [facility] [address] [place], [place], [place] Phone: [phone]   Fax:  [phone]  Name: MYRICK MCNAIRY MRN: Claudina Lick Date of Birth: 1970-09-21

## 2020-02-12 NOTE — Patient Instructions (Signed)
Access Code: [email or web address] URL: [email or web address] Date: 02/12/2020 Prepared by: Georges Lynch  Exercises Supine Quad Set - 2-3 x daily - 7 x weekly - 2 sets - 10 reps - 5 second hold Active Straight Leg Raise with Quad Set - 2-3 x daily - 7 x weekly - 2 sets - 10 reps Sidelying Hip Abduction - 2-3 x daily - 7 x weekly - 2 sets - 10 reps

## 2020-02-19 ENCOUNTER — Ambulatory Visit (HOSPITAL_COMMUNITY): Payer: PRIVATE HEALTH INSURANCE

## 2020-02-19 ENCOUNTER — Other Ambulatory Visit: Payer: Self-pay

## 2020-02-19 ENCOUNTER — Encounter (HOSPITAL_COMMUNITY): Payer: Self-pay

## 2020-02-19 DIAGNOSIS — M25561 Pain in right knee: Secondary | ICD-10-CM | POA: Diagnosis not present

## 2020-02-19 DIAGNOSIS — R2689 Other abnormalities of gait and mobility: Secondary | ICD-10-CM

## 2020-02-19 NOTE — Therapy (Signed)
[facility] [address] [place], [place], [address] Phone: [phone]   Fax:  [phone]  Physical Therapy Treatment  Patient Details  Name: Zachary Mayo MRN: [id number] Date of Birth: 1970/06/14 Referring Provider (PT): Lavada Mesi MD    Encounter Date: 02/19/2020   PT End of Session - 02/19/20 0847    Visit Number 2    Number of Visits 8    Date for PT Re-Evaluation 03/12/20    Authorization Type [employer]/ Generic commercial secondary    PT Start Time 0847    PT Stop Time 0928    PT Time Calculation (min) 41 min    Activity Tolerance Patient tolerated treatment well    Behavior During Therapy [facility] for tasks assessed/performed           Past Medical History:  Diagnosis Date  . Allergy   . Diabetes mellitus without complication (HCC)    type 2    Past Surgical History:  Procedure Laterality Date  . NO PAST SURGERIES      There were no vitals filed for this visit.   Subjective Assessment - 02/19/20 0847    Subjective Patient reports a pop when bending right knee one time; pain with pop and then it went away. Worked late last night. Increased pain in the mornings compared to rest of day.    Limitations Lifting;Standing;Walking    Diagnostic tests MRI    Patient Stated Goals Get back to normal, get back to running    Currently in Pain? Yes    Pain Score 3     Pain Location Knee    Pain Orientation Right;Lateral    Pain Onset More than a month ago              [facility] PT Assessment - 02/19/20 0001      Flexibility   Soft Tissue Assessment /Muscle Length yes    ITB moderate right restriction noted             [facility] Adult PT Treatment/Exercise - 02/19/20 0001      Knee/Hip Exercises: Seated   Sit to Sand 10 reps;without UE support   squeeze yellow ball between knees     Knee/Hip Exercises: Supine   Quad Sets Right;10 reps   5 sec hold   Straight Leg Raises --    Straight Leg Raise with External  Rotation Strengthening;Right;1 set;10 reps   5 sec hold   Knee Extension AROM    Knee Extension Limitations 0    Knee Flexion AROM    Knee Flexion Limitations 125      Knee/Hip Exercises: Sidelying   Hip ABduction Strengthening;Right;1 set;10 reps    Hip ABduction Limitations VMO activiation first      Knee/Hip Exercises: Prone   Hip Extension Strengthening;Right;1 set;10 reps   VMO activitation first           PT Education - 02/19/20 0909    Education Details Discussed purpose and technique of interventions throughout session. Advanced HEP.    Person(s) Educated Patient    Methods Explanation;Handout    Comprehension Verbalized understanding            PT Short Term Goals - 02/12/20 1021      PT SHORT TERM GOAL #1   Title Patient will be independent with initial HEP and self-management strategies to improve functional outcomes    Time 2    Period Weeks    Status New  Target Date 02/27/20             PT Long Term Goals - 02/12/20 1021      PT LONG TERM GOAL #1   Title Patient will improve FOTO score by 15% to indicate improvement in functional outcomes    Time 4    Period Weeks    Status New    Target Date 03/12/20      PT LONG TERM GOAL #2   Title Patient will report at least 75% overall improvement in subjective complaint to indicate improvement in ability to perform ADLs.    Time 4    Period Weeks    Status New    Target Date 03/12/20      PT LONG TERM GOAL #3   Title Patient will have RT knee AROM  at least 0-120 degrees to improve functional mobility and facilitate squatting to pick up items from floor.    Time 4    Period Weeks    Status New    Target Date 03/12/20      PT LONG TERM GOAL #4   Title Patient will be able to return to running/ jogging up to 1 mile with no signfiicant increase in knee pain to return to PLOF    Time 4    Period Weeks    Status New    Target Date 03/12/20              Plan - 02/19/20 0847    Clinical  Impression Statement Reviewed initial evaluation and goals and initial HEP. Patient able to recreate initial HEP with minimal cues for VMO activation and leading with heel towards ceiling with sidelying hip abduction. Moderate flexibility restriction noted in right iliotibial band. Added sidelying ITB "pretzel" stretch, prone hip extension, and STS with ball squeeze for hip adduction. Added prone hip extension and sidelying "pretzel" stretch to HEP for hip strengthening and ITB stretching. Patient AROM right knee 0-125 degrees today with improved gait mechanics as well. Continue with current plan; progress as able.    Personal Factors and Comorbidities Comorbidity 1    Comorbidities DM    Examination-Activity Limitations Locomotion Level;Stand;Stairs;Squat;Transfers    Examination-Participation Restrictions Occupation;Community Activity;Yard Work;Cleaning    Stability/Clinical Decision Making Stable/Uncomplicated    Rehab Potential Good    PT Frequency 2x / week    PT Duration 4 weeks    PT Treatment/Interventions ADLs/Self Care Home Management;Aquatic Therapy;Biofeedback;Cryotherapy;Electrical Stimulation;Moist Heat;Traction;Balance training;Therapeutic exercise;Manual techniques;Orthotic Fit/Training;Stair training;Functional mobility training;Therapeutic activities;Iontophoresis 4mg /ml Dexamethasone;Manual lymph drainage;Vestibular;Vasopneumatic Device;Splinting;Taping;Energy conservation;Dry needling;Patient/family education;DME Instruction;Gait training;Scar mobilization;Compression bandaging;Neuromuscular re-education;Ultrasound;Parrafin;Fluidtherapy;Contrast Bath;Visual/perceptual remediation/compensation;Spinal Manipulations;Joint Manipulations;Passive range of motion    PT Next Visit Plan Review goals and HEP. Progress RT knee strength and mobility as tolerated. Progress strength>ROM> functional strength>balance>proprioception>plyometrics. Manuals PRN. Add heel slides and bridge next visit;  consider sidelying foam rolling for right ITB flexibility.    PT Home Exercise Plan Eval: quad set, SLR, sidlying hip abduction; ITB "pretzel" stretch, prone hip extension    Consulted and Agree with Plan of Care Patient           Patient will benefit from skilled therapeutic intervention in order to improve the following deficits and impairments:  Abnormal gait,Hypomobility,Decreased activity tolerance,Decreased strength,Pain,Increased fascial restricitons,Decreased balance,Difficulty walking,Decreased mobility,Decreased range of motion,Improper body mechanics,Impaired flexibility  Visit Diagnosis: Right knee pain, unspecified chronicity  Other abnormalities of gait and mobility     Problem List Patient Active Problem List   Diagnosis Date Noted  . Hypertension associated with diabetes (  HCC) 01/24/2019  . Tachycardia 10/22/2018  . Testicular pain 10/22/2018  . Dyslipidemia associated with type 2 diabetes mellitus (HCC) 01/08/2013  . Uncontrolled type 2 diabetes mellitus with hyperglycemia, with long-term current use of insulin (HCC) 01/08/2013  . Seasonal allergies 01/29/2011    Katina Dung. Hartnett-Rands, MS, PT Per Ladoris Gene [facility] #[id number] 02/19/2020, 9:49 AM  [facility] [address] [place], [place], [address] Phone: [phone]   Fax:  [phone]  Name: Zachary Mayo MRN: [id number] Date of Birth: [birth year]

## 2020-02-19 NOTE — Patient Instructions (Addendum)
HIP: Abduction - Side-Lying    Lie on side, legs straight and in line with trunk. Use pinky muscle to straighten knee as much as possible first. Squeeze glutes. Raise top leg up approximately 9-12 inches and slightly back. Point toes forward; heel to ceiling first.10-15_ reps per set, 1___ sets per day. Bend bottom leg to stabilize pelvis.  Copyright  VHI. All rights reserved.   Quadriceps Stretch    Pull right heel in toward buttocks until a comfortable stretch is felt in front of thigh. Keep right heel in line with right buttock. Push right hip forward. FOR PRETZEL STRETCH: Cross left foot over right knee. Hold _30___ seconds. Repeat _3___ times per set. Do 1____ sets per session.  http://orth.exer.us/154   Copyright  VHI. All rights reserved.   Prone Hip and Knee Extension    Try to lift right leg, keeping knee as straight as possible. Do not lift or turn hips. Hold _3-5___ seconds. Repeat _10-15___ times. Do __1__ sessions per day.  http://gt2.exer.us/308   Copyright  VHI. All rights reserved.

## 2020-02-23 ENCOUNTER — Other Ambulatory Visit: Payer: Self-pay

## 2020-02-23 ENCOUNTER — Encounter (HOSPITAL_COMMUNITY): Payer: Self-pay | Admitting: Physical Therapy

## 2020-02-23 ENCOUNTER — Ambulatory Visit (HOSPITAL_COMMUNITY): Payer: PRIVATE HEALTH INSURANCE | Admitting: Physical Therapy

## 2020-02-23 DIAGNOSIS — M25561 Pain in right knee: Secondary | ICD-10-CM

## 2020-02-23 DIAGNOSIS — R2689 Other abnormalities of gait and mobility: Secondary | ICD-10-CM

## 2020-02-23 NOTE — Therapy (Signed)
[facility] [address] [place], [place], [address] Phone: [phone]   Fax:  [phone]  Physical Therapy Treatment  Patient Details  Name: Zachary Mayo MRN: [id number] Date of Birth: April 20, 1970 Referring Provider (PT): Lavada Mesi MD    Encounter Date: 02/23/2020   PT End of Session - 02/23/20 0905    Visit Number 3    Number of Visits 8    Date for PT Re-Evaluation 03/12/20    Authorization Type [employer]/ Generic commercial secondary    PT Start Time 0900    PT Stop Time 0938    PT Time Calculation (min) 38 min    Activity Tolerance Patient tolerated treatment well    Behavior During Therapy [facility] for tasks assessed/performed           Past Medical History:  Diagnosis Date   Allergy    Diabetes mellitus without complication (HCC)    type 2    Past Surgical History:  Procedure Laterality Date   NO PAST SURGERIES      There were no vitals filed for this visit.   Subjective Assessment - 02/23/20 0905    Subjective Patient says things are going well. Knee is doing pretty good. No new issues. No pain currently. Compliance with HEP.    Limitations Lifting;Standing;Walking    Diagnostic tests MRI    Patient Stated Goals Get back to normal, get back to running    Currently in Pain? No/denies    Pain Onset More than a month ago                             [facility] Adult PT Treatment/Exercise - 02/23/20 0001      Knee/Hip Exercises: Stretches   Other Knee/Hip Stretches RT ITB "pretzel stretch" 5 x 10"      Knee/Hip Exercises: Standing   Lateral Step Up Right;1 set;15 reps;Hand Hold: 0;Step Height: 4"    Forward Step Up Right;1 set;15 reps;Hand Hold: 0;Step Height: 4"    SLS 3 x 15" solid floor, 3 x 15" foam      Knee/Hip Exercises: Seated   Sit to Sand 2 sets;10 reps   with yellow ball adduction squeeze     Knee/Hip Exercises: Supine   Quad Sets Right;15 reps    Quad Sets Limitations  5" hold    Bridges Both;1 set;15 reps    Bridges Limitations 5" hold    Straight Leg Raises Right;15 reps    Straight Leg Raises Limitations with quad set      Knee/Hip Exercises: Sidelying   Hip ABduction Right;1 set;15 reps    Hip ADduction Right;1 set;15 reps      Knee/Hip Exercises: Prone   Hip Extension Right;1 set;15 reps                    PT Short Term Goals - 02/12/20 1021      PT SHORT TERM GOAL #1   Title Patient will be independent with initial HEP and self-management strategies to improve functional outcomes    Time 2    Period Weeks    Status New    Target Date 02/27/20             PT Long Term Goals - 02/12/20 1021      PT LONG TERM GOAL #1   Title Patient will improve FOTO score by 15% to indicate improvement in functional  outcomes    Time 4    Period Weeks    Status New    Target Date 03/12/20      PT LONG TERM GOAL #2   Title Patient will report at least 75% overall improvement in subjective complaint to indicate improvement in ability to perform ADLs.    Time 4    Period Weeks    Status New    Target Date 03/12/20      PT LONG TERM GOAL #3   Title Patient will have RT knee AROM  at least 0-120 degrees to improve functional mobility and facilitate squatting to pick up items from floor.    Time 4    Period Weeks    Status New    Target Date 03/12/20      PT LONG TERM GOAL #4   Title Patient will be able to return to running/ jogging up to 1 mile with no signfiicant increase in knee pain to return to PLOF    Time 4    Period Weeks    Status New    Target Date 03/12/20                 Plan - 02/23/20 0943    Clinical Impression Statement Patient tolerated session well today. Patient able to progress reps with sit to stand and added bridge and hip adduction for LE strength progressions and VMO activation. Patient educated on proper form and function of added exercise. Patient cued on target muscle activation and proper leg  positioning with sidelying hip abduction and adduction. Patient reports no increased complaint of pain. Patient was challenged with added foam to single leg stand. Patient will continue to benefit from skilled therapy services to progress LE strength and stabilization to reduce knee pain and improve LOF with ADLs.    Personal Factors and Comorbidities Comorbidity 1    Comorbidities DM    Examination-Activity Limitations Locomotion Level;Stand;Stairs;Squat;Transfers    Examination-Participation Restrictions Occupation;Community Activity;Yard Work;Cleaning    Stability/Clinical Decision Making Stable/Uncomplicated    Rehab Potential Good    PT Frequency 2x / week    PT Duration 4 weeks    PT Treatment/Interventions ADLs/Self Care Home Management;Aquatic Therapy;Biofeedback;Cryotherapy;Electrical Stimulation;Moist Heat;Traction;Balance training;Therapeutic exercise;Manual techniques;Orthotic Fit/Training;Stair training;Functional mobility training;Therapeutic activities;Iontophoresis 4mg /ml Dexamethasone;Manual lymph drainage;Vestibular;Vasopneumatic Device;Splinting;Taping;Energy conservation;Dry needling;Patient/family education;DME Instruction;Gait training;Scar mobilization;Compression bandaging;Neuromuscular re-education;Ultrasound;Parrafin;Fluidtherapy;Contrast Bath;Visual/perceptual remediation/compensation;Spinal Manipulations;Joint Manipulations;Passive range of motion    PT Next Visit Plan Review goals and HEP. Progress RT knee strength and mobility as tolerated. Progress strength>ROM> functional strength>balance>proprioception>plyometrics. Manuals PRN. Add heel slides and bridge next visit; consider sidelying foam rolling for right ITB flexibility. add band sidestepping next visit    PT Home Exercise Plan Eval: quad set, SLR, sidlying hip abduction; ITB "pretzel" stretch, prone hip extension 02/23/20: sidlying hip adduction, bridge    Consulted and Agree with Plan of Care Patient            Patient will benefit from skilled therapeutic intervention in order to improve the following deficits and impairments:  Abnormal gait,Hypomobility,Decreased activity tolerance,Decreased strength,Pain,Increased fascial restricitons,Decreased balance,Difficulty walking,Decreased mobility,Decreased range of motion,Improper body mechanics,Impaired flexibility  Visit Diagnosis: Right knee pain, unspecified chronicity  Other abnormalities of gait and mobility     Problem List Patient Active Problem List   Diagnosis Date Noted   Hypertension associated with diabetes (HCC) 01/24/2019   Tachycardia 10/22/2018   Testicular pain 10/22/2018   Dyslipidemia associated with type 2 diabetes mellitus (HCC) 01/08/2013   Uncontrolled type 2 diabetes mellitus with  hyperglycemia, with long-term current use of insulin (HCC) 01/08/2013   Seasonal allergies 01/29/2011   9:44 AM, 02/23/20 Georges Lynch PT DPT  Physical Therapist with [place]  [facility]  [phone]   [facility] [address] [place], [place], [address] Phone: [phone]   Fax:  [phone]  Name: Zachary Mayo MRN: [id number] Date of Birth: 04-28-1970

## 2020-02-23 NOTE — Patient Instructions (Signed)
Access Code: [email or web address] URL: [email or web address] Date: 02/23/2020 Prepared by: Georges Lynch  Exercises Sidelying Hip Adduction - 2 x daily - 7 x weekly - 2 sets - 10 reps Supine Bridge - 2 x daily - 7 x weekly - 2 sets - 10 reps - 5 second hold

## 2020-02-26 ENCOUNTER — Encounter (HOSPITAL_COMMUNITY): Payer: Self-pay

## 2020-02-26 ENCOUNTER — Other Ambulatory Visit: Payer: Self-pay

## 2020-02-26 ENCOUNTER — Ambulatory Visit (HOSPITAL_COMMUNITY): Payer: PRIVATE HEALTH INSURANCE

## 2020-02-26 DIAGNOSIS — R2689 Other abnormalities of gait and mobility: Secondary | ICD-10-CM

## 2020-02-26 DIAGNOSIS — M25561 Pain in right knee: Secondary | ICD-10-CM | POA: Diagnosis not present

## 2020-02-26 NOTE — Therapy (Signed)
[facility] [address] [place], [place], [address] Phone: [phone]   Fax:  [phone]  Physical Therapy Treatment  Patient Details  Name: Zachary Mayo MRN: [id number] Date of Birth: [birth year] Referring Provider (PT): Eunice Blase MD    Encounter Date: 02/26/2020   PT End of Session - 02/26/20 0846    Visit Number 4    Number of Visits 8    Date for PT Re-Evaluation 03/12/20    Authorization Type [employer]/ Generic commercial secondary    PT Start Time 0847    PT Stop Time 0928    PT Time Calculation (min) 41 min    Activity Tolerance Patient tolerated treatment well    Behavior During Therapy [facility] for tasks assessed/performed           Past Medical History:  Diagnosis Date   Allergy    Diabetes mellitus without complication ([place])    type 2    Past Surgical History:  Procedure Laterality Date   NO PAST SURGERIES      There were no vitals filed for this visit.   Subjective Assessment - 02/26/20 0845    Subjective No pain; improving function and walking distance. 75% improvement in pain and function since beginning therapy. A little pain at times but much better.    Limitations Lifting;Standing;Walking    Diagnostic tests MRI    Patient Stated Goals Get back to normal, get back to running    Pain Onset More than a month ago             [facility] Adult PT Treatment/Exercise - 02/26/20 0001      Knee/Hip Exercises: Stretches   ITB Stretch Right;3 reps;60 seconds    ITB Stretch Limitations sidelying on foam roll    Other Knee/Hip Stretches RT ITB "pretzel stretch" 3x30"      Knee/Hip Exercises: Standing   Lateral Step Up Right;1 set;15 reps;Hand Hold: 0;Step Height: 4"    Forward Step Up Right;1 set;15 reps;Step Height: 4"      Knee/Hip Exercises: Seated   Sit to Sand 2 sets;10 reps   with yellow ball adduction squeeze     Knee/Hip Exercises: Supine   Quad Sets --    Quad Sets Limitations --     Bridges Strengthening;Both;1 set;15 reps   on green physioball   Bridges Limitations 2-3 sec hold    Single Leg Bridge Strengthening;Right;1 set;15 reps    Straight Leg Raises Right;15 reps    Straight Leg Raises Limitations with quad set; 5: hold, 2# ankle weight    Knee Flexion AROM    Knee Flexion Limitations 133      Knee/Hip Exercises: Sidelying   Hip ABduction Right;1 set;15 reps    Hip ADduction Right;1 set;15 reps      Knee/Hip Exercises: Prone   Hip Extension Right;1 set;15 reps             PT Education - 02/26/20 0904    Education Details Discussed purpose and technique of interventions throughout session.    Person(s) Educated Patient    Methods Explanation    Comprehension Verbalized understanding            PT Short Term Goals - 02/26/20 0846      PT SHORT TERM GOAL #1   Title Patient will be independent with initial HEP and self-management strategies to improve functional outcomes    Time 2    Period Weeks    Status  Achieved    Target Date 02/27/20             PT Long Term Goals - 02/26/20 0846      PT LONG TERM GOAL #1   Title Patient will improve FOTO score by 15% to indicate improvement in functional outcomes    Time 4    Period Weeks    Status On-going      PT LONG TERM GOAL #2   Title Patient will report at least 75% overall improvement in subjective complaint to indicate improvement in ability to perform ADLs.    Baseline 02/26/2020 - patient reports 75% improvement today but not ready to beginning jogging yet.    Time 4    Period Weeks    Status Partially Met      PT LONG TERM GOAL #3   Title Patient will have RT knee AROM  at least 0-120 degrees to improve functional mobility and facilitate squatting to pick up items from floor.    Baseline 12//16/2021 - 0-133 degrees    Time 4    Period Weeks    Status Achieved      PT LONG TERM GOAL #4   Title Patient will be able to return to running/ jogging up to 1 mile with no signfiicant  increase in knee pain to return to PLOF    Baseline 02/26/2020 - patient reports he is not ready to begin this just yet.    Time 4    Period Weeks    Status On-going              Plan - 02/26/20 0846    Clinical Impression Statement Patient tolerated session well today. Patient able to progress reps with single leg bridges and bilateral leg bridges on green physioball. Added sidelying foam roll for ITB flexibility and self soft tissue mobilization. Patient required verbal cuing for forward and lateral steps ups and minimal upper extremity support for dynamic balance.  Patient will continue to benefit from skilled therapy services to progress LE strength and stabilization to reduce knee pain and improve LOF with ADLs.    Personal Factors and Comorbidities Comorbidity 1    Comorbidities DM    Examination-Activity Limitations Locomotion Level;Stand;Stairs;Squat;Transfers    Examination-Participation Restrictions Occupation;Community Activity;Yard Work;Cleaning    Stability/Clinical Decision Making Stable/Uncomplicated    Rehab Potential Good    PT Frequency 2x / week    PT Duration 4 weeks    PT Treatment/Interventions ADLs/Self Care Home Management;Aquatic Therapy;Biofeedback;Cryotherapy;Electrical Stimulation;Moist Heat;Traction;Balance training;Therapeutic exercise;Manual techniques;Orthotic Fit/Training;Stair training;Functional mobility training;Therapeutic activities;Iontophoresis 56m/ml Dexamethasone;Manual lymph drainage;Vestibular;Vasopneumatic Device;Splinting;Taping;Energy conservation;Dry needling;Patient/family education;DME Instruction;Gait training;Scar mobilization;Compression bandaging;Neuromuscular re-education;Ultrasound;Parrafin;Fluidtherapy;Contrast Bath;Visual/perceptual remediation/compensation;Spinal Manipulations;Joint Manipulations;Passive range of motion    PT Next Visit Plan Progress RT knee strength and mobility as tolerated. Progress strength>ROM> functional  strength>balance>proprioception>plyometrics. Manuals PRN. Add heel slides and bridge next visit; consider sidelying foam rolling for right ITB flexibility. add band sidestepping next visit    PT Home Exercise Plan Eval: quad set, SLR, sidlying hip abduction; ITB "pretzel" stretch, prone hip extension 02/23/20: sidlying hip adduction, bridge    Consulted and Agree with Plan of Care Patient           Patient will benefit from skilled therapeutic intervention in order to improve the following deficits and impairments:  Abnormal gait,Hypomobility,Decreased activity tolerance,Decreased strength,Pain,Increased fascial restricitons,Decreased balance,Difficulty walking,Decreased mobility,Decreased range of motion,Improper body mechanics,Impaired flexibility  Visit Diagnosis: Right knee pain, unspecified chronicity  Other abnormalities of gait and mobility     Problem  List Patient Active Problem List   Diagnosis Date Noted   Hypertension associated with diabetes ([place]) 01/24/2019   Tachycardia 10/22/2018   Testicular pain 10/22/2018   Dyslipidemia associated with type 2 diabetes mellitus ([place]) 01/08/2013   Uncontrolled type 2 diabetes mellitus with hyperglycemia, with long-term current use of insulin ([place]) 01/08/2013   Seasonal allergies 01/29/2011    Floria Raveling. Hartnett-Rands, MS, PT Per [facility] #[id number] 02/26/2020, 10:09 AM  [facility] [address] [place], [place], [address] Phone: [phone]   Fax:  [phone]  Name: Zachary Mayo MRN: [id number] Date of Birth: 1970/10/21

## 2020-03-01 ENCOUNTER — Telehealth (HOSPITAL_COMMUNITY): Payer: Self-pay | Admitting: Physical Therapy

## 2020-03-01 ENCOUNTER — Ambulatory Visit (HOSPITAL_COMMUNITY): Payer: PRIVATE HEALTH INSURANCE | Admitting: Physical Therapy

## 2020-03-01 NOTE — Telephone Encounter (Signed)
pt cancelled appt for today because he is not feeling well today

## 2020-03-04 ENCOUNTER — Other Ambulatory Visit: Payer: Self-pay

## 2020-03-04 ENCOUNTER — Encounter (HOSPITAL_COMMUNITY): Payer: Self-pay

## 2020-03-04 ENCOUNTER — Ambulatory Visit (HOSPITAL_COMMUNITY): Payer: PRIVATE HEALTH INSURANCE

## 2020-03-04 DIAGNOSIS — R2689 Other abnormalities of gait and mobility: Secondary | ICD-10-CM

## 2020-03-04 DIAGNOSIS — M25561 Pain in right knee: Secondary | ICD-10-CM | POA: Diagnosis not present

## 2020-03-04 NOTE — Therapy (Signed)
[facility] [place], [place], [address] Phone: [phone]   Fax:  [phone]  Physical Therapy Treatment  Patient Details  Name: Zachary Mayo MRN: [id number] Date of Birth: 1971-02-14 Referring Provider (PT): Eunice Blase MD    Encounter Date: 03/04/2020   PT End of Session - 03/04/20 1122    Visit Number 5    Number of Visits 8    Date for PT Re-Evaluation 03/12/20    Authorization Type [employer]/ Generic commercial secondary    PT Start Time 1121    PT Stop Time 1200    PT Time Calculation (min) 39 min    Activity Tolerance Patient tolerated treatment well    Behavior During Therapy [facility] for tasks assessed/performed           Past Medical History:  Diagnosis Date  . Allergy   . Diabetes mellitus without complication ([place])    type 2    Past Surgical History:  Procedure Laterality Date  . NO PAST SURGERIES      There were no vitals filed for this visit.   Subjective Assessment - 03/04/20 1120    Subjective Pt reports he is tired today, worked til 1:30 this morning.  No reports of pain currently.  Reports most difficulty with balance, reports stairs are improving.    Patient Stated Goals Get back to normal, get back to running    Currently in Pain? No/denies                             [facility] Adult PT Treatment/Exercise - 03/04/20 0001      Exercises   Exercises Knee/Hip      Knee/Hip Exercises: Standing   Heel Raises 15 reps    Heel Raises Limitations no HHA, incline slope 5" holds    Lateral Step Up Right;1 set;15 reps;Hand Hold: 0;Step Height: 4"    Lateral Step Up Limitations eccentric control    Forward Step Up Right;15 reps;Hand Hold: 0;Hand Hold: 1;Step Height: 6"    Functional Squat 10 reps    Functional Squat Limitations front of chair to improve mechanics, no UE support required    SLS 3x    SLS with Vectors 3x 5"    Other Standing Knee Exercises GTB side step  2RT      Knee/Hip Exercises: Seated   Sit to Sand 10 reps;without UE support   eccentric control     Knee/Hip Exercises: Supine   Bridges 2 sets;15 reps;3 sets;5 reps   feet on green ball bridge as well as heel slide on ball   Bridges Limitations 2-3 sec hold    Single Leg Bridge Strengthening;Right;1 set;15 reps      Knee/Hip Exercises: Sidelying   Other Sidelying Knee/Hip Exercises sidelying ITB foam roll x 2 minutes                    PT Short Term Goals - 02/26/20 0846      PT SHORT TERM GOAL #1   Title Patient will be independent with initial HEP and self-management strategies to improve functional outcomes    Time 2    Period Weeks    Status Achieved    Target Date 02/27/20             PT Long Term Goals - 02/26/20 0846      PT LONG TERM GOAL #1   Title  Patient will improve FOTO score by 15% to indicate improvement in functional outcomes    Time 4    Period Weeks    Status On-going      PT LONG TERM GOAL #2   Title Patient will report at least 75% overall improvement in subjective complaint to indicate improvement in ability to perform ADLs.    Baseline 02/26/2020 - patient reports 75% improvement today but not ready to beginning jogging yet.    Time 4    Period Weeks    Status Partially Met      PT LONG TERM GOAL #3   Title Patient will have RT knee AROM  at least 0-120 degrees to improve functional mobility and facilitate squatting to pick up items from floor.    Baseline 12//16/2021 - 0-133 degrees    Time 4    Period Weeks    Status Achieved      PT LONG TERM GOAL #4   Title Patient will be able to return to running/ jogging up to 1 mile with no signfiicant increase in knee pain to return to PLOF    Baseline 02/26/2020 - patient reports he is not ready to begin this just yet.    Time 4    Period Weeks    Status On-going                 Plan - 03/04/20 1154    Clinical Impression Statement Pt progressing well towards goals.  Added  functional squats and hip strengthening exercises to improve dynamic balance with minimal UE support.  Pt able to complete with good form following initial cuieng for knee alignment ad eccentric control with step up training and STS.  Noted visible muscule fatigue with increased height with forward step up and during bridges on theraball.  No reports of pain thorugh session.    Personal Factors and Comorbidities Comorbidity 1    Comorbidities DM    Examination-Activity Limitations Locomotion Level;Stand;Stairs;Squat;Transfers    Examination-Participation Restrictions Occupation;Community Activity;Yard Work;Cleaning    Stability/Clinical Decision Making Stable/Uncomplicated    Clinical Decision Making Low    Rehab Potential Good    PT Frequency 2x / week    PT Duration 4 weeks    PT Treatment/Interventions ADLs/Self Care Home Management;Aquatic Therapy;Biofeedback;Cryotherapy;Electrical Stimulation;Moist Heat;Traction;Balance training;Therapeutic exercise;Manual techniques;Orthotic Fit/Training;Stair training;Functional mobility training;Therapeutic activities;Iontophoresis 57m/ml Dexamethasone;Manual lymph drainage;Vestibular;Vasopneumatic Device;Splinting;Taping;Energy conservation;Dry needling;Patient/family education;DME Instruction;Gait training;Scar mobilization;Compression bandaging;Neuromuscular re-education;Ultrasound;Parrafin;Fluidtherapy;Contrast Bath;Visual/perceptual remediation/compensation;Spinal Manipulations;Joint Manipulations;Passive range of motion    PT Next Visit Plan Progress RT knee strength and mobility as tolerated. Progress strength>ROM> functional strength>balance>proprioception>plyometrics. Manuals PRN. Add heel slides and bridge next visit; consider sidelying foam rolling for right ITB flexibility.    PT Home Exercise Plan Eval: quad set, SLR, sidlying hip abduction; ITB "pretzel" stretch, prone hip extension 02/23/20: sidlying hip adduction, bridge           Patient  will benefit from skilled therapeutic intervention in order to improve the following deficits and impairments:  Abnormal gait,Hypomobility,Decreased activity tolerance,Decreased strength,Pain,Increased fascial restricitons,Decreased balance,Difficulty walking,Decreased mobility,Decreased range of motion,Improper body mechanics,Impaired flexibility  Visit Diagnosis: Other abnormalities of gait and mobility  Right knee pain, unspecified chronicity     Problem List Patient Active Problem List   Diagnosis Date Noted  . Hy[place]ension associated with diabetes ([place] 01/24/2019  . Tachycardia 10/22/2018  . Testicular pain 10/22/2018  . Dyslipidemia associated with type 2 diabetes mellitus ([place] 01/08/2013  . Uncontrolled type 2 diabetes mellitus with hyperglycemia, with long-term current use of insulin (  [place]) 01/08/2013  . Seasonal allergies 01/29/2011   Ihor Austin, LPTA/CLT; CBIS [phone]  Aldona Lento 03/04/2020, 12:13 PM  [facility], [place], [address] Phone: [phone]   Fax:  [phone]  Name: Zachary Mayo MRN: [id number] Date of Birth: 05/10/1970

## 2020-03-09 ENCOUNTER — Telehealth (HOSPITAL_COMMUNITY): Payer: Self-pay | Admitting: Physical Therapy

## 2020-03-09 ENCOUNTER — Ambulatory Visit (HOSPITAL_COMMUNITY): Payer: PRIVATE HEALTH INSURANCE | Admitting: Physical Therapy

## 2020-03-09 NOTE — Telephone Encounter (Signed)
NO Show  Pt called, no answer.  Therapist left message that his next appointment is Thursday at 9:00.  Virgina Organ, PT CLT [phone]

## 2020-03-11 ENCOUNTER — Ambulatory Visit (HOSPITAL_COMMUNITY): Payer: PRIVATE HEALTH INSURANCE | Admitting: Physical Therapy

## 2020-03-11 ENCOUNTER — Telehealth (HOSPITAL_COMMUNITY): Payer: Self-pay | Admitting: Physical Therapy

## 2020-03-11 NOTE — Telephone Encounter (Signed)
pt cancelled appt because he had to work late

## 2020-07-06 ENCOUNTER — Other Ambulatory Visit: Payer: Self-pay

## 2020-07-06 MED ORDER — SILDENAFIL CITRATE 50 MG PO TABS
ORAL_TABLET | ORAL | 1 refills | Status: DC
  Filled 2020-07-06: qty 6, 30d supply, fill #0

## 2020-10-01 ENCOUNTER — Ambulatory Visit (INDEPENDENT_AMBULATORY_CARE_PROVIDER_SITE_OTHER): Payer: No Typology Code available for payment source

## 2020-10-01 ENCOUNTER — Other Ambulatory Visit: Payer: Self-pay

## 2020-10-01 ENCOUNTER — Encounter: Payer: Self-pay | Admitting: Emergency Medicine

## 2020-10-01 ENCOUNTER — Telehealth: Payer: Self-pay | Admitting: Orthopedic Surgery

## 2020-10-01 ENCOUNTER — Ambulatory Visit
Admission: EM | Admit: 2020-10-01 | Discharge: 2020-10-01 | Disposition: A | Discharge status: Home / Self Care | Payer: No Typology Code available for payment source | Attending: Emergency Medicine | Admitting: Emergency Medicine

## 2020-10-01 DIAGNOSIS — W19XXXA Unspecified fall, initial encounter: Secondary | ICD-10-CM | POA: Diagnosis not present

## 2020-10-01 DIAGNOSIS — M25521 Pain in right elbow: Secondary | ICD-10-CM

## 2020-10-01 DIAGNOSIS — S59901A Unspecified injury of right elbow, initial encounter: Secondary | ICD-10-CM

## 2020-10-01 MED ORDER — PREDNISONE 20 MG PO TABS: 20.0000 mg | ORAL_TABLET | Freq: Two times a day (BID) | ORAL | 0 refills | Status: AC

## 2020-10-01 NOTE — ED Provider Notes (Signed)
[facility]ENTER   [id number] 10/01/20 Arrival Time: 1201  CC: RT arm pain/ injury   SUBJECTIVE: History from: patient. Zachary Mayo is a 50 y.o. male complains of RT arm x 1 week.  Fall after missing last step that occurred 1 week ago.  Landed on RT elbow/ forearm.  Localizes the pain to the RT elbow and forearm.  Describes the pain as intermittent, throbbing, and achy in character.  Has tried OTC medications without relief.  Symptoms are made worse with movement.  Denies similar symptoms in the past.  Denies fever, chills, erythema, ecchymosis, effusion, weakness, numbness and tingling.  ROS: As per HPI.  All other pertinent ROS negative.     Past Medical History:  Diagnosis Date   Allergy    Diabetes mellitus without complication (HCC)    type 2   Past Surgical History:  Procedure Laterality Date   NO PAST SURGERIES     No Known Allergies No current facility-administered medications on file prior to encounter.   Current Outpatient Medications on File Prior to Encounter  Medication Sig Dispense Refill   cetirizine (ZYRTEC) 10 MG tablet Take 10 mg by mouth daily as needed for allergies.     diclofenac Sodium (VOLTAREN) 1 % GEL Apply 4 g topically 4 (four) times daily as needed. 500 g 6   Insulin Glargine, 1 Unit Dial, (TOUJEO SOLOSTAR) 300 UNIT/ML SOPN Inject into the skin.     lisinopril (ZESTRIL) 5 MG tablet Take 5 mg by mouth daily.     meloxicam (MOBIC) 15 MG tablet Take 0.5-1 tablets (7.5-15 mg total) by mouth daily as needed for pain. 30 tablet 6   metFORMIN (GLUCOPHAGE-XR) 500 MG 24 hr tablet Take by mouth.     sildenafil (VIAGRA) 50 MG tablet Take 1 tablet by mouth as needed an hour before sex. Do not take more than 1 time in 48 hours. This is not a daily medicine. 30 tablet 1   Social History   Socioeconomic History   Marital status: Single    Spouse name: Not on file   Number of children: Not on file   Years of education: Not on file   Highest education  level: Not on file  Occupational History   Not on file  Tobacco Use   Smoking status: Never   Smokeless tobacco: Never  Vaping Use   Vaping Use: Never used  Substance and Sexual Activity   Alcohol use: No   Drug use: No   Sexual activity: Never  Other Topics Concern   Not on file  Social History Narrative   Not on file   Social Determinants of Health   Financial Resource Strain: Not on file  Food Insecurity: Not on file  Transportation Needs: Not on file  Physical Activity: Not on file  Stress: Not on file  Social Connections: Not on file  Intimate Partner Violence: Not on file   Family History  Problem Relation Age of Onset   Diabetes Mother    Diabetes Father    Diabetes Maternal Grandmother    Diabetes Maternal Grandfather    Diabetes Paternal Grandmother    Diabetes Paternal Grandfather     OBJECTIVE:  Vitals:   10/01/20 1310  BP: 136/87  Pulse: (!) 122  Resp: 18  Temp: 98.2 F (36.8 C)  TempSrc: Tympanic  SpO2: 96%    General appearance: ALERT; in no acute distress.  Head: NCAT Lungs: Normal respiratory effort CV: Radial pulse 2+ Musculoskeletal: RT arm  Inspection: Skin warm, dry, clear and intact without obvious erythema, effusion, or ecchymosis.  Palpation: diffusely TTP over proximal forearm, medially and laterally ROM: FROM active and passive Strength: 5/5 elbow flexion, 5/5 elbow extension, 5/5 grip strength Skin: warm and dry Neurologic: Ambulates without difficulty Psychological: alert and cooperative; normal mood and affect  DIAGNOSTIC STUDIES:  DG Elbow Complete Right  Result Date: 10/01/2020 CLINICAL DATA:  fall, pain EXAM: RIGHT ELBOW - COMPLETE 3+ VIEW COMPARISON:  None. FINDINGS: There is no evidence of fracture, dislocation, or joint effusion. Tiny enthesophyte at the triceps tendon insertion. There is no evidence of arthropathy or other focal bone abnormality. Soft tissues are unremarkable. IMPRESSION: Negative. Electronically  Signed   By: Duanne Guess D.O.   On: 10/01/2020 13:50     X-rays negative for bony abnormalities including fracture, or dislocation.  No soft tissue swelling.    I have reviewed the x-rays myself and the radiologist interpretation. I am in agreement with the radiologist interpretation.     ASSESSMENT & PLAN:  1. Right elbow pain   2. Injury of right elbow, initial encounter     Meds ordered this encounter  Medications   predniSONE (DELTASONE) 20 MG tablet    Sig: Take 1 tablet (20 mg total) by mouth 2 (two) times daily with a meal for 5 days.    Dispense:  10 tablet    Refill:  0    Order Specific Question:   Supervising Provider    Answer:   Eustace Moore [[id number]]    X-rays negative for fracture or dislocation Continue conservative management of rest, ice, and elevation Ace bandage applied Prednisone prescribed Follow up with PCP or orthopedist if symptoms persist Return or go to the ER if you have any new or worsening symptoms (fever, chills, chest pain, redness, swelling, deformity, etc...)   Reviewed expectations re: course of current medical issues. Questions answered. Outlined signs and symptoms indicating need for more acute intervention. Patient verbalized understanding. After Visit Summary given.     Rennis Harding, PA-C 10/01/20 1402

## 2020-10-01 NOTE — Discharge Instructions (Addendum)
X-rays negative for fracture or dislocation Continue conservative management of rest, ice, and elevation Ace bandage applied Prednisone prescribed Follow up with PCP or orthopedist if symptoms persist Return or go to the ER if you have any new or worsening symptoms (fever, chills, chest pain, redness, swelling, deformity, etc...)

## 2020-10-01 NOTE — ED Triage Notes (Signed)
Larey Seat a week ago down steps pain to right elbow

## 2020-10-01 NOTE — Telephone Encounter (Signed)
Call from patient, inquiring about immediate appointment with Dr Romeo Apple or one of our doctors for problem of arm hurting due to fall. Relayed that we have no providers today, and due to Dr Romeo Apple and one of our providers being out of office this week - relayed protocol regarding falls, of going to urgent care or emergency room for work up. York Spaniel will go to [facility] in [place].

## 2021-02-01 ENCOUNTER — Encounter (HOSPITAL_COMMUNITY): Payer: Self-pay | Admitting: Physical Therapy

## 2021-02-01 NOTE — Therapy (Signed)
[name], [place], [address] Phone: [phone]   Fax:  [phone]  Patient Details  Name: Zachary Mayo MRN: [id number] Date of Birth: 02-11-1971 Referring Provider:  No ref. provider found  Encounter Date: 02/01/2021  PHYSICAL THERAPY DISCHARGE SUMMARY  Visits from Start of Care: 5  Current functional level related to goals / functional outcomes: NA   Remaining deficits: NA   Education / Equipment: Did not return for therapy, DC     Patient goals were not met. Patient is being discharged due to not returning since the last visit.  3:12 PM, 02/01/21 Josue Hector PT DPT  Physical Therapist with [facility]  [phone] [place], [place], [address] Phone: [phone]   Fax:  [phone]

## 2021-03-23 ENCOUNTER — Other Ambulatory Visit: Payer: Self-pay

## 2021-03-23 ENCOUNTER — Encounter: Payer: Self-pay | Admitting: Emergency Medicine

## 2021-03-23 ENCOUNTER — Ambulatory Visit
Admission: EM | Admit: 2021-03-23 | Discharge: 2021-03-23 | Disposition: A | Discharge status: Home / Self Care | Payer: BC Managed Care – PPO | Attending: Urgent Care | Admitting: Urgent Care

## 2021-03-23 DIAGNOSIS — H9201 Otalgia, right ear: Secondary | ICD-10-CM

## 2021-03-23 DIAGNOSIS — E119 Type 2 diabetes mellitus without complications: Secondary | ICD-10-CM

## 2021-03-23 DIAGNOSIS — R000 Tachycardia, unspecified: Secondary | ICD-10-CM

## 2021-03-23 DIAGNOSIS — Z794 Long term (current) use of insulin: Secondary | ICD-10-CM

## 2021-03-23 DIAGNOSIS — R070 Pain in throat: Secondary | ICD-10-CM | POA: Diagnosis not present

## 2021-03-23 DIAGNOSIS — J069 Acute upper respiratory infection, unspecified: Secondary | ICD-10-CM | POA: Diagnosis not present

## 2021-03-23 MED ORDER — IPRATROPIUM BROMIDE 0.03 % NA SOLN: 2.0000 | Freq: Two times a day (BID) | NASAL | 0 refills | Status: AC

## 2021-03-23 MED ORDER — LEVOCETIRIZINE DIHYDROCHLORIDE 5 MG PO TABS: 5.0000 mg | ORAL_TABLET | Freq: Every evening | ORAL | 0 refills | Status: DC

## 2021-03-23 NOTE — ED Triage Notes (Signed)
Pt reports facial pressure, sore throat, right ear pain since Sunday. Nad noted. Airway patent.

## 2021-03-23 NOTE — ED Provider Notes (Signed)
[facility]   MRN: [id number] DOB: [birth year]  Subjective:   Zachary Mayo is a 51 y.o. male presenting for 3 to 4-day history of acute onset right ear pain, right-sided throat pain, right-sided facial pressure.  No fever, headache, ear drainage, chest pain, shortness of breath, coughing.  Patient has a history of tachycardia but has never seen a heart doctor that he knows of.  He has previously had to take atenolol but is not on this now.  Has a history of allergic rhinitis and type 2 diabetes treated with insulin.  No history of MI, heart disease, arrhythmias.  No current facility-administered medications for this encounter.  Current Outpatient Medications:    cetirizine (ZYRTEC) 10 MG tablet, Take 10 mg by mouth daily as needed for allergies., Disp: , Rfl:    diclofenac Sodium (VOLTAREN) 1 % GEL, Apply 4 g topically 4 (four) times daily as needed., Disp: 500 g, Rfl: 6   Insulin Glargine, 1 Unit Dial, (TOUJEO SOLOSTAR) 300 UNIT/ML SOPN, Inject into the skin., Disp: , Rfl:    lisinopril (ZESTRIL) 5 MG tablet, Take 5 mg by mouth daily., Disp: , Rfl:    meloxicam (MOBIC) 15 MG tablet, Take 0.5-1 tablets (7.5-15 mg total) by mouth daily as needed for pain., Disp: 30 tablet, Rfl: 6   metFORMIN (GLUCOPHAGE-XR) 500 MG 24 hr tablet, Take by mouth., Disp: , Rfl:    sildenafil (VIAGRA) 50 MG tablet, Take 1 tablet by mouth as needed an hour before sex. Do not take more than 1 time in 48 hours. This is not a daily medicine., Disp: 30 tablet, Rfl: 1   No Known Allergies  Past Medical History:  Diagnosis Date   Allergy    Diabetes mellitus without complication ([place])    type 2     Past Surgical History:  Procedure Laterality Date   NO PAST SURGERIES      Family History  Problem Relation Age of Onset   Diabetes Mother    Diabetes Father    Diabetes Maternal Grandmother    Diabetes Maternal Grandfather    Diabetes Paternal Grandmother    Diabetes Paternal Grandfather      Social History   Tobacco Use   Smoking status: Never   Smokeless tobacco: Never  Vaping Use   Vaping Use: Never used  Substance Use Topics   Alcohol use: No   Drug use: No    ROS   Objective:   Vitals: BP (!) 133/96 (BP Location: Right Arm)    Pulse (!) 124    Temp 98.5 F (36.9 C) (Oral)    Resp 18    Ht 5\' 10"  (1.778 m)    Wt 155 lb (70.3 kg)    SpO2 96%    BMI 22.24 kg/m   Wt Readings from Last 3 Encounters:  03/23/21 155 lb (70.3 kg)  02/14/19 154 lb 6 oz (70 kg)  01/24/19 155 lb (70.3 kg)   Temp Readings from Last 3 Encounters:  03/23/21 98.5 F (36.9 C) (Oral)  10/01/20 98.2 F (36.8 C) (Tympanic)  09/09/19 99.5 F (37.5 C)   BP Readings from Last 3 Encounters:  03/23/21 (!) 133/96  10/01/20 136/87  09/09/19 (!) 139/96   Pulse Readings from Last 3 Encounters:  03/23/21 (!) 124  10/01/20 (!) 122  09/09/19 (!) 142   Physical Exam Constitutional:      General: He is not in acute distress.    Appearance: Normal appearance. He is well-developed and normal  weight. He is not ill-appearing, toxic-appearing or diaphoretic.  HENT:     Head: Normocephalic and atraumatic.     Right Ear: Tympanic membrane, ear canal and external ear normal. There is no impacted cerumen.     Left Ear: Tympanic membrane, ear canal and external ear normal. There is no impacted cerumen.     Nose: Nose normal. No congestion or rhinorrhea.     Mouth/Throat:     Mouth: Mucous membranes are moist.     Pharynx: No oropharyngeal exudate or posterior oropharyngeal erythema.  Eyes:     General: No scleral icterus.       Right eye: No discharge.        Left eye: No discharge.     Extraocular Movements: Extraocular movements intact.     Conjunctiva/sclera: Conjunctivae normal.  Cardiovascular:     Rate and Rhythm: Regular rhythm. Tachycardia present.     Heart sounds: Normal heart sounds. No murmur heard.   No friction rub. No gallop.  Pulmonary:     Effort: Pulmonary effort is  normal. No respiratory distress.     Breath sounds: Normal breath sounds. No stridor. No wheezing, rhonchi or rales.  Musculoskeletal:     Cervical back: Normal range of motion and neck supple. No rigidity. No muscular tenderness.  Neurological:     General: No focal deficit present.     Mental Status: He is alert and oriented to person, place, and time.  Psychiatric:        Mood and Affect: Mood normal.        Behavior: Behavior normal.        Thought Content: Thought content normal.        Judgment: Judgment normal.    ED ECG REPORT   Date: 03/23/2021  EKG Time: 9:46 AM  Rate: 123 bpm  Rhythm: sinus tachycardia,  there are no previous tracings available for comparison  Axis: normal  Intervals:none  ST&T Change: None  Narrative Interpretation: Sinus tachycardia 123 bpm with left ventricular hypertrophy.  No previous EKG available for comparison.   Assessment and Plan :   PDMP not reviewed this encounter.  1. Viral upper respiratory infection   2. Type 2 diabetes mellitus treated with insulin ([place])   3. Right ear pain   4. Throat pain   5. Tachycardia    Patient has significant tachycardia but is more chronic appearing than new in onset.  Either way, emphasized that he needs very close follow-up with cardiology for a recheck.  Apart from the tachycardia, no alarming findings on the EKG.  Deferred imaging given clear cardiopulmonary exam, hemodynamically stable vital signs. Will manage for viral illness such as viral URI, viral syndrome, viral rhinitis, COVID-19. Recommended supportive care. Offered scripts for symptomatic relief. Testing is pending. Counseled patient on potential for adverse effects with medications prescribed/recommended today, ER and return-to-clinic precautions discussed, patient verbalized understanding.     Jaynee Eagles, [place] 03/23/21 [phone]

## 2021-03-23 NOTE — Discharge Instructions (Signed)
We will notify you of your test results as they arrive and may take between 48-72 hours.  I encourage you to sign up for MyChart if you have not already done so as this can be the easiest way for [place] to communicate results to you online or through a phone app.  Generally, we only contact you if it is a positive test result.  In the meantime, if you develop worsening symptoms including fever, chest pain, shortness of breath despite our current treatment plan then please report to the emergency room as this may be a sign of worsening status from possible viral infection.  Otherwise, we will manage this as a viral syndrome. For sore throat or cough try using a honey-based tea. Use 3 teaspoons of honey with juice squeezed from half lemon. Place shaved pieces of ginger into 1/2-1 cup of water and warm over stove top. Then mix the ingredients and repeat every 4 hours as needed. Please take Tylenol 500mg -650mg  every 6 hours for aches and pains, fevers. Hydrate very well with at least 2 liters of water. Eat light meals such as soups to replenish electrolytes and soft fruits, veggies. Start an antihistamine like Zyrtec for postnasal drainage, sinus congestion.  Use the nasal spray as needed.

## 2021-03-24 LAB — NOVEL CORONAVIRUS, NAA: SARS-CoV-2, NAA: NOT DETECTED

## 2021-03-24 LAB — SARS-COV-2, NAA 2 DAY TAT

## 2021-08-10 ENCOUNTER — Ambulatory Visit: Payer: BC Managed Care – PPO

## 2021-08-10 ENCOUNTER — Other Ambulatory Visit: Payer: Self-pay

## 2021-08-10 ENCOUNTER — Ambulatory Visit
Admission: EM | Admit: 2021-08-10 | Discharge: 2021-08-10 | Disposition: A | Discharge status: Home / Self Care | Payer: BC Managed Care – PPO | Attending: Nurse Practitioner | Admitting: Nurse Practitioner

## 2021-08-10 ENCOUNTER — Encounter: Payer: Self-pay | Admitting: Emergency Medicine

## 2021-08-10 ENCOUNTER — Ambulatory Visit (INDEPENDENT_AMBULATORY_CARE_PROVIDER_SITE_OTHER): Payer: BC Managed Care – PPO

## 2021-08-10 DIAGNOSIS — M25571 Pain in right ankle and joints of right foot: Secondary | ICD-10-CM | POA: Diagnosis not present

## 2021-08-10 NOTE — ED Provider Notes (Signed)
[facility]    CSN: [id number] Arrival date & time: 08/10/21  4259      History   Chief Complaint Chief Complaint  Patient presents with   Foot Pain    HPI Zachary Mayo is a 51 y.o. male.   Patient presents with pain in the right ankle that started approximately 2 to 3 days ago.  Patient reports an injury when he missed stepped approximately 1 month ago.  He states at that time the ankle was "hyperextended".  He states over the past several days, he has noticed worsening pain with flexion, extension, and with walking.  He also states that he has had intermittent swelling.  Pain radiates into the top of the foot and into the right calf.  The history is provided by the patient.   Past Medical History:  Diagnosis Date   Allergy    Diabetes mellitus without complication (HCC)    type 2    Patient Active Problem List   Diagnosis Date Noted   Hypertension associated with diabetes (HCC) 01/24/2019   Tachycardia 10/22/2018   Testicular pain 10/22/2018   Dyslipidemia associated with type 2 diabetes mellitus (HCC) 01/08/2013   Uncontrolled type 2 diabetes mellitus with hyperglycemia, with long-term current use of insulin (HCC) 01/08/2013   Seasonal allergies 01/29/2011    Past Surgical History:  Procedure Laterality Date   NO PAST SURGERIES         Home Medications    Prior to Admission medications   Medication Sig Start Date End Date Taking? Authorizing Provider  cetirizine (ZYRTEC) 10 MG tablet Take 10 mg by mouth daily as needed for allergies.    [provider]  diclofenac Sodium (VOLTAREN) 1 % GEL Apply 4 g topically 4 (four) times daily as needed. 12/25/19   Hilts, Casimiro Needle, MD  Insulin Glargine, 1 Unit Dial, (TOUJEO SOLOSTAR) 300 UNIT/ML SOPN Inject into the skin. 10/16/18   [provider]  ipratropium (ATROVENT) 0.03 % nasal spray Place 2 sprays into both nostrils 2 (two) times daily. 03/23/21   Wallis Bamberg, PA-C  levocetirizine  (XYZAL) 5 MG tablet Take 1 tablet (5 mg total) by mouth every evening. 03/23/21   Wallis Bamberg, PA-C  lisinopril (ZESTRIL) 5 MG tablet Take 5 mg by mouth daily.    [provider]  meloxicam (MOBIC) 15 MG tablet Take 0.5-1 tablets (7.5-15 mg total) by mouth daily as needed for pain. 12/25/19   Hilts, Casimiro Needle, MD  metFORMIN (GLUCOPHAGE-XR) 500 MG 24 hr tablet Take by mouth. 10/06/18   [provider]  sildenafil (VIAGRA) 50 MG tablet Take 1 tablet by mouth as needed an hour before sex. Do not take more than 1 time in 48 hours. This is not a daily medicine. 07/05/20       Family History Family History  Problem Relation Age of Onset   Diabetes Mother    Diabetes Father    Diabetes Maternal Grandmother    Diabetes Maternal Grandfather    Diabetes Paternal Grandmother    Diabetes Paternal Grandfather     Social History Social History   Tobacco Use   Smoking status: Never   Smokeless tobacco: Never  Vaping Use   Vaping Use: Never used  Substance Use Topics   Alcohol use: No   Drug use: No     Allergies   Patient has no known allergies.   Review of Systems Review of Systems Per HPI  Physical Exam Triage Vital Signs ED Triage Vitals [08/10/21  1901]  Enc Vitals Group     BP (!) 165/93     Pulse Rate (!) 132     Resp 18     Temp 98.8 F (37.1 C)     Temp Source Oral     SpO2 95 %     Weight 155 lb (70.3 kg)     Height 5\' 9"  (1.753 m)     Head Circumference      Peak Flow      Pain Score 5     Pain Loc      Pain Edu?      Excl. in GC?    No data found.  Updated Vital Signs BP (!) 165/93 (BP Location: Right Arm)   Pulse (!) 132   Temp 98.8 F (37.1 C) (Oral)   Resp 18   Ht 5\' 9"  (1.753 m)   Wt 155 lb (70.3 kg)   SpO2 95%   BMI 22.89 kg/m   Visual Acuity Right Eye Distance:   Left Eye Distance:   Bilateral Distance:    Right Eye Near:   Left Eye Near:    Bilateral Near:     Physical Exam Vitals and nursing note reviewed.  HENT:      Head: Normocephalic.  Eyes:     Pupils: Pupils are equal, round, and reactive to light.  Pulmonary:     Effort: Pulmonary effort is normal.  Musculoskeletal:     Cervical back: Normal range of motion.     Right ankle: No swelling or deformity. Tenderness present over the ATF ligament. Normal range of motion. Normal pulse.  Skin:    General: Skin is warm and dry.     Capillary Refill: Capillary refill takes less than 2 seconds.  Neurological:     General: No focal deficit present.     Mental Status: He is oriented to person, place, and time.  Psychiatric:        Mood and Affect: Mood normal.        Behavior: Behavior normal.     UC Treatments / Results  Labs (all labs ordered are listed, but only abnormal results are displayed) Labs Reviewed - No data to display  EKG   Radiology DG Ankle Complete Right  Result Date: 08/10/2021 CLINICAL DATA:  Right ankle pain EXAM: RIGHT ANKLE - COMPLETE 3+ VIEW COMPARISON:  None Available. FINDINGS: No fracture or dislocation is seen. The ankle mortise is intact. Mild lateral soft tissue swelling. IMPRESSION: Negative. Electronically Signed   By: Charline BillsSriyesh  Krishnan M.D.   On: 08/10/2021 19:27    Procedures Procedures (including critical care time)  Medications Ordered in UC Medications - No data to display  Initial Impression / Assessment and Plan / UC Course  I have reviewed the triage vital signs and the nursing notes.  Pertinent labs & imaging results that were available during my care of the patient were reviewed by me and considered in my medical decision making (see chart for details).  Patient presents with right ankle pain that has been present for the past month.  Symptoms have worsened over the past 2 to 3 days.  His x-rays are negative for any fracture or dislocation.  Cannot rule out any tendon or ligamental injury.  Patient does complain of radiation of pain into the foot and calf.  His exam is reassuring, he does have full  range of motion of the right ankle.  Ace wrap was provided to add wear and compression.  Recommended supportive care to include ice, ibuprofen, and RICE therapy.  Patient was advised to follow-up with Ortho care of [place] for further evaluation. Final Clinical Impressions(s) / UC Diagnoses   Final diagnoses:  Acute right ankle pain     Discharge Instructions      Your x-rays are negative for fracture or dislocation. Apply ice to the affected area to help with pain or swelling.  Apply for 20 minutes, remove for 1 hour, then repeat.  Do this is much as possible when needed. RICE therapy, rest, ice, compression and elevation.  Recommend using the Ace wrap when you are participating in prolonged walking, standing, and or strenuous activity. If your symptoms fail to improve, recommend following up with orthopedics for further evaluation.  You can follow-up with Ortho care at [facility] at [phone].      ED Prescriptions   None    PDMP not reviewed this encounter.   Abran Cantor, NP 08/10/21 1950

## 2021-08-10 NOTE — Discharge Instructions (Addendum)
Your x-rays are negative for fracture or dislocation. Apply ice to the affected area to help with pain or swelling.  Apply for 20 minutes, remove for 1 hour, then repeat.  Do this is much as possible when needed. RICE therapy, rest, ice, compression and elevation.  Recommend using the Ace wrap when you are participating in prolonged walking, standing, and or strenuous activity. If your symptoms fail to improve, recommend following up with orthopedics for further evaluation.  You can follow-up with Ortho care at [facility] at [phone].

## 2021-08-10 NOTE — ED Triage Notes (Signed)
Pt reports right foot pain that has increased last several days. Pt reports a few months ago misjudged a step and reports right foot folded underneath. Pt reports pain did not start until several days later began intermittently hurting and sensitive to touch. Pt reports pain is in top of right foot/ankle and radiates to right calf. No obvious deformity noted.

## 2022-01-09 ENCOUNTER — Other Ambulatory Visit: Payer: Self-pay

## 2022-01-09 ENCOUNTER — Emergency Department (HOSPITAL_COMMUNITY): Payer: Self-pay

## 2022-01-09 ENCOUNTER — Encounter (HOSPITAL_COMMUNITY): Payer: Self-pay

## 2022-01-09 ENCOUNTER — Emergency Department (HOSPITAL_COMMUNITY)
Admission: EM | Admit: 2022-01-09 | Discharge: 2022-01-09 | Disposition: A | Discharge status: Home / Self Care | Payer: Self-pay | Attending: Emergency Medicine | Admitting: Emergency Medicine

## 2022-01-09 DIAGNOSIS — I10 Essential (primary) hypertension: Secondary | ICD-10-CM | POA: Insufficient documentation

## 2022-01-09 DIAGNOSIS — Z7984 Long term (current) use of oral hypoglycemic drugs: Secondary | ICD-10-CM | POA: Insufficient documentation

## 2022-01-09 DIAGNOSIS — Z794 Long term (current) use of insulin: Secondary | ICD-10-CM | POA: Insufficient documentation

## 2022-01-09 DIAGNOSIS — E1165 Type 2 diabetes mellitus with hyperglycemia: Secondary | ICD-10-CM | POA: Insufficient documentation

## 2022-01-09 DIAGNOSIS — R739 Hyperglycemia, unspecified: Secondary | ICD-10-CM

## 2022-01-09 DIAGNOSIS — Z79899 Other long term (current) drug therapy: Secondary | ICD-10-CM | POA: Insufficient documentation

## 2022-01-09 HISTORY — DX: Essential (primary) hypertension: I10

## 2022-01-09 LAB — URINALYSIS, ROUTINE W REFLEX MICROSCOPIC
Bacteria, UA: NONE SEEN
Bilirubin Urine: NEGATIVE
Glucose, UA: >=500 mg/dL — AB
Hgb urine dipstick: NEGATIVE
Ketones, ur: 5 mg/dL — AB
Leukocytes,Ua: NEGATIVE
Nitrite: NEGATIVE
Protein, ur: NEGATIVE mg/dL
Specific Gravity, Urine: 1.029 (ref 1.005–1.030)
pH: 6 (ref 5.0–8.0)

## 2022-01-09 LAB — COMPREHENSIVE METABOLIC PANEL
ALT: 20 U/L (ref 0–44)
AST: 19 U/L (ref 15–41)
Albumin: 4 g/dL (ref 3.5–5.0)
Alkaline Phosphatase: 86 U/L (ref 38–126)
Anion gap: 8 (ref 5–15)
BUN: 17 mg/dL (ref 6–20)
CO2: 26 mmol/L (ref 22–32)
Calcium: 9.3 mg/dL (ref 8.9–10.3)
Chloride: 100 mmol/L (ref 98–111)
Creatinine, Ser: 0.83 mg/dL (ref 0.61–1.24)
GFR, Estimated: >60 mL/min (ref >60)
Glucose, Bld: 365 mg/dL — ABNORMAL HIGH (ref 70–99)
Potassium: 4.2 mmol/L (ref 3.5–5.1)
Sodium: 134 mmol/L — ABNORMAL LOW (ref 135–145)
Total Bilirubin: 1.7 mg/dL — ABNORMAL HIGH (ref 0.3–1.2)
Total Protein: 6.8 g/dL (ref 6.5–8.1)

## 2022-01-09 LAB — CBC WITH DIFFERENTIAL/PLATELET
Abs Immature Granulocytes: 0.01 10*3/uL (ref 0.00–0.07)
Basophils Absolute: 0 10*3/uL (ref 0.0–0.1)
Basophils Relative: 1 %
Eosinophils Absolute: 0 10*3/uL (ref 0.0–0.5)
Eosinophils Relative: 1 %
HCT: 41.6 % (ref 39.0–52.0)
Hemoglobin: 14.9 g/dL (ref 13.0–17.0)
Immature Granulocytes: 0 %
Lymphocytes Relative: 35 %
Lymphs Abs: 1.4 10*3/uL (ref 0.7–4.0)
MCH: 32.1 pg (ref 26.0–34.0)
MCHC: 35.8 g/dL (ref 30.0–36.0)
MCV: 89.7 fL (ref 80.0–100.0)
Monocytes Absolute: 0.4 10*3/uL (ref 0.1–1.0)
Monocytes Relative: 9 %
Neutro Abs: 2.3 10*3/uL (ref 1.7–7.7)
Neutrophils Relative %: 54 %
Platelets: 178 10*3/uL (ref 150–400)
RBC: 4.64 MIL/uL (ref 4.22–5.81)
RDW: 11.2 % — ABNORMAL LOW (ref 11.5–15.5)
WBC: 4.2 10*3/uL (ref 4.0–10.5)
nRBC: 0 % (ref 0.0–0.2)

## 2022-01-09 LAB — MAGNESIUM: Magnesium: 1.9 mg/dL (ref 1.7–2.4)

## 2022-01-09 LAB — CBG MONITORING, ED
Glucose-Capillary: 368 mg/dL — ABNORMAL HIGH (ref 70–99)
Glucose-Capillary: 251 mg/dL — ABNORMAL HIGH (ref 70–99)

## 2022-01-09 MED ORDER — INSULIN ASPART 100 UNIT/ML IJ SOLN
5.0000 [IU] | Freq: Once | INTRAMUSCULAR | Status: AC
  Administered 2022-01-09: 5 [IU] via SUBCUTANEOUS
  Filled 2022-01-09: qty 1

## 2022-01-09 MED ORDER — LACTATED RINGERS IV BOLUS
1000.0000 mL | Freq: Once | INTRAVENOUS | Status: AC
  Administered 2022-01-09: 1000 mL via INTRAVENOUS

## 2022-01-09 MED ORDER — LISINOPRIL 10 MG PO TABS
10.0000 mg | ORAL_TABLET | Freq: Once | ORAL | Status: AC
  Administered 2022-01-09: 10 mg via ORAL
  Filled 2022-01-09: qty 1

## 2022-01-09 MED ORDER — LISINOPRIL 5 MG PO TABS: 5.0000 mg | ORAL_TABLET | Freq: Once | ORAL | Status: DC

## 2022-01-09 MED ORDER — LISINOPRIL 10 MG PO TABS: 10.0000 mg | ORAL_TABLET | Freq: Every day | ORAL | 2 refills | Status: AC

## 2022-01-09 NOTE — ED Provider Notes (Signed)
[facility] EMERGENCY DEPARTMENT Provider Note   CSN: [id number] Arrival date & time: 01/09/22  1706     History {Add pertinent medical, surgical, social history, OB history to HPI:1} Chief Complaint  Patient presents with   Hypertension    Zachary Mayo is a 51 y.o. male.   Hypertension Pertinent negatives include no chest pain and no shortness of breath.  Patient presents for incidental finding of hypertension and tachycardia at employment physical.  Medical history includes hypertension and diabetes.  For his diabetes, he takes metformin.  He does not check his blood sugar at home.  He denies any recent polyuria.  He was previously prescribed lisinopril for his hypertension.  He has not taken this in several weeks.  Patient states that he has been in his normal state of health.  He denies any new or recent symptoms.  He denies frequent or recent alcohol use.  He denies any illicit drug use.     Home Medications Prior to Admission medications   Medication Sig Start Date End Date Taking? Authorizing Provider  cetirizine (ZYRTEC) 10 MG tablet Take 10 mg by mouth daily as needed for allergies.    [provider]  diclofenac Sodium (VOLTAREN) 1 % GEL Apply 4 g topically 4 (four) times daily as needed. 12/25/19   Hilts, Legrand Como, MD  Insulin Glargine, 1 Unit Dial, (TOUJEO SOLOSTAR) 300 UNIT/ML SOPN Inject into the skin. 10/16/18   [provider]  ipratropium (ATROVENT) 0.03 % nasal spray Place 2 sprays into both nostrils 2 (two) times daily. 03/23/21   Jaynee Eagles, PA-C  levocetirizine (XYZAL) 5 MG tablet Take 1 tablet (5 mg total) by mouth every evening. 03/23/21   Jaynee Eagles, PA-C  lisinopril (ZESTRIL) 5 MG tablet Take 5 mg by mouth daily.    [provider]  meloxicam (MOBIC) 15 MG tablet Take 0.5-1 tablets (7.5-15 mg total) by mouth daily as needed for pain. 12/25/19   Hilts, Legrand Como, MD  metFORMIN (GLUCOPHAGE-XR) 500 MG 24 hr tablet Take by mouth. 10/06/18    [provider]  sildenafil (VIAGRA) 50 MG tablet Take 1 tablet by mouth as needed an hour before sex. Do not take more than 1 time in 48 hours. This is not a daily medicine. 07/05/20         Allergies    Patient has no known allergies.    Review of Systems   Review of Systems  Constitutional:  Negative for fatigue.  Respiratory:  Negative for shortness of breath.   Cardiovascular:  Negative for chest pain.  Gastrointestinal:  Negative for nausea.  Endocrine: Negative for polyuria.  All other systems reviewed and are negative.   Physical Exam Updated Vital Signs BP (!) 171/106   Pulse (!) 110   Temp 98.9 F (37.2 C) (Oral)   Resp 19   Ht 5\' 9"  (1.753 m)   Wt 65.8 kg   SpO2 99%   BMI 21.41 kg/m  Physical Exam Vitals and nursing note reviewed.  Constitutional:      General: He is not in acute distress.    Appearance: Normal appearance. He is well-developed. He is not ill-appearing, toxic-appearing or diaphoretic.  HENT:     Head: Normocephalic and atraumatic.     Right Ear: External ear normal.     Left Ear: External ear normal.     Nose: Nose normal.     Mouth/Throat:     Mouth: Mucous membranes are moist.     Pharynx: Oropharynx  is clear.  Eyes:     Extraocular Movements: Extraocular movements intact.     Conjunctiva/sclera: Conjunctivae normal.  Cardiovascular:     Rate and Rhythm: Normal rate and regular rhythm.     Heart sounds: No murmur heard. Pulmonary:     Effort: Pulmonary effort is normal. No respiratory distress.     Breath sounds: Normal breath sounds. No wheezing, rhonchi or rales.  Chest:     Chest wall: No tenderness.  Abdominal:     General: There is no distension.     Palpations: Abdomen is soft.     Tenderness: There is no abdominal tenderness.  Musculoskeletal:        General: No swelling. Normal range of motion.     Cervical back: Normal range of motion.     Right lower leg: No edema.     Left lower leg: No edema.  Skin:     General: Skin is warm and dry.     Capillary Refill: Capillary refill takes less than 2 seconds.     Coloration: Skin is not jaundiced or pale.  Neurological:     General: No focal deficit present.     Mental Status: He is alert and oriented to person, place, and time.     Cranial Nerves: No cranial nerve deficit.     Sensory: No sensory deficit.     Motor: No weakness.     Coordination: Coordination normal.  Psychiatric:        Mood and Affect: Mood normal.        Behavior: Behavior normal.        Thought Content: Thought content normal.        Judgment: Judgment normal.     ED Results / Procedures / Treatments   Labs (all labs ordered are listed, but only abnormal results are displayed) Labs Reviewed - No data to display  EKG None  Radiology No results found.  Procedures Procedures  {Document cardiac monitor, telemetry assessment procedure when appropriate:1}  Medications Ordered in ED Medications - No data to display  ED Course/ Medical Decision Making/ A&P                           Medical Decision Making Amount and/or Complexity of Data Reviewed Labs: ordered. Radiology: ordered.  Risk Prescription drug management.   This patient presents to the ED for concern of ***, this involves an extensive number of treatment options, and is a complaint that carries with it a high risk of complications and morbidity.  The differential diagnosis includes ***   Co morbidities that complicate the patient evaluation  ***   Additional history obtained:  Additional history obtained from *** External records from outside source obtained and reviewed including ***   Lab Tests:  I Ordered, and personally interpreted labs.  The pertinent results include:  ***   Imaging Studies ordered:  I ordered imaging studies including ***  I independently visualized and interpreted imaging which showed *** I agree with the radiologist interpretation   Cardiac Monitoring: /  EKG:  The patient was maintained on a cardiac monitor.  I personally viewed and interpreted the cardiac monitored which showed an underlying rhythm of: ***   Consultations Obtained:  I requested consultation with the ***,  and discussed lab and imaging findings as well as pertinent plan - they recommend: ***   Problem List / ED Course / Critical interventions / Medication management  ***  I ordered medication including ***  for ***  Reevaluation of the patient after these medicines showed that the patient {resolved/improved/worsened:23923::"improved"} I have reviewed the patients home medicines and have made adjustments as needed   Social Determinants of Health:  ***   Test / Admission - Considered:  ***   {Document critical care time when appropriate:1} {Document review of labs and clinical decision tools ie heart score, Chads2Vasc2 etc:1}  {Document your independent review of radiology images, and any outside records:1} {Document your discussion with family members, caretakers, and with consultants:1} {Document social determinants of health affecting pt's care:1} {Document your decision making why or why not admission, treatments were needed:1} Final Clinical Impression(s) / ED Diagnoses Final diagnoses:  None    Rx / DC Orders ED Discharge Orders     None

## 2022-01-09 NOTE — ED Notes (Signed)
Pt requesting glucose check.

## 2022-01-09 NOTE — ED Triage Notes (Addendum)
Pt presents to ED with complaints of hypertension. Pt states he was at a pre-employment physical BP 161/99. Pt currently takes Lisinopril but has not had in 2 weeks due to being laid off at work. Pt HR also 130's at physical.  Pt denies headache, dizziness

## 2022-01-09 NOTE — Discharge Instructions (Signed)
A prescription for blood pressure medicine was sent to your pharmacy.  Take this as prescribed.  Follow-up with your primary care doctor for ongoing management of your blood pressure and diabetes.  He will require further trial and error with medications to maintain a good range of blood pressure and blood sugar.  Return to the emergency department at any time for any new or worsening symptoms of concern.

## 2022-01-10 ENCOUNTER — Other Ambulatory Visit: Payer: Self-pay

## 2022-01-13 ENCOUNTER — Other Ambulatory Visit: Payer: Self-pay

## 2022-01-16 ENCOUNTER — Other Ambulatory Visit: Payer: Self-pay

## 2022-01-20 ENCOUNTER — Other Ambulatory Visit: Payer: Self-pay

## 2022-01-24 ENCOUNTER — Other Ambulatory Visit: Payer: Self-pay

## 2022-05-11 ENCOUNTER — Encounter: Payer: Self-pay | Admitting: Radiology

## 2022-08-29 ENCOUNTER — Ambulatory Visit
Admission: EM | Admit: 2022-08-29 | Discharge: 2022-08-29 | Disposition: A | Discharge status: Home / Self Care | Payer: Managed Care, Other (non HMO) | Attending: Nurse Practitioner | Admitting: Nurse Practitioner

## 2022-08-29 DIAGNOSIS — Z1152 Encounter for screening for COVID-19: Secondary | ICD-10-CM | POA: Diagnosis present

## 2022-08-29 DIAGNOSIS — J029 Acute pharyngitis, unspecified: Secondary | ICD-10-CM | POA: Insufficient documentation

## 2022-08-29 DIAGNOSIS — J069 Acute upper respiratory infection, unspecified: Secondary | ICD-10-CM | POA: Insufficient documentation

## 2022-08-29 LAB — POCT RAPID STREP A (OFFICE): Rapid Strep A Screen: NEGATIVE

## 2022-08-29 NOTE — ED Provider Notes (Signed)
[facility]    CSN: [id number] Arrival date & time: 08/29/22  1523      History   Chief Complaint No chief complaint on file.   HPI Zachary Mayo is a 52 y.o. male.   Patient presents today with 2-day history of dry cough, runny nose, sore throat, sinus pressure in his cheeks that is not improved, right-sided ear pain, decreased appetite because her to swallow, and fatigue.  He denies fever, bodyaches or chills, congested cough, shortness of breath or chest pain, stuffy nose, abdominal pain, nausea/vomiting, and diarrhea.  Reports he has history of tonsil stones, yesterday saw some white specks on the back of his tonsils.  He then gargled with some salt water and noted the white specks were gone.  Reports sore throat has remained which caused him concern.  He has also used a warm towel on his face which seems to help open up his sinuses per his report.  He reports history of allergies for which she takes Zyrtec.   Patient has history of diabetes; reports blood sugars have been in the 120s.     Past Medical History:  Diagnosis Date   Allergy    Diabetes mellitus without complication (HCC)    type 2   Hypertension     Patient Active Problem List   Diagnosis Date Noted   Hypertension associated with diabetes (HCC) 01/24/2019   Tachycardia 10/22/2018   Testicular pain 10/22/2018   Dyslipidemia associated with type 2 diabetes mellitus (HCC) 01/08/2013   Uncontrolled type 2 diabetes mellitus with hyperglycemia, with long-term current use of insulin (HCC) 01/08/2013   Seasonal allergies 01/29/2011    Past Surgical History:  Procedure Laterality Date   NO PAST SURGERIES         Home Medications    Prior to Admission medications   Medication Sig Start Date End Date Taking? Authorizing Provider  amLODipine (NORVASC) 2.5 MG tablet Take by mouth. 06/27/22  Yes [provider]  atenolol (TENORMIN) 25 MG tablet Take by mouth. 03/07/22  Yes [provider]  atorvastatin (LIPITOR) 20 MG tablet Take 20 mg by mouth daily.    [provider]  cetirizine (ZYRTEC) 10 MG tablet Take 10 mg by mouth daily as needed for allergies.    [provider]  diclofenac Sodium (VOLTAREN) 1 % GEL Apply 4 g topically 4 (four) times daily as needed. 12/25/19   Hilts, Casimiro Needle, MD  Insulin Glargine, 1 Unit Dial, (TOUJEO SOLOSTAR) 300 UNIT/ML SOPN Inject 10 Units into the skin daily. 10/16/18   [provider]  insulin lispro (HUMALOG) 100 UNIT/ML KwikPen Inject into the skin.    [provider]  ipratropium (ATROVENT) 0.03 % nasal spray Place 2 sprays into both nostrils 2 (two) times daily. 03/23/21   Wallis Bamberg, PA-C  lisinopril (ZESTRIL) 10 MG tablet Take 1 tablet (10 mg total) by mouth daily. 01/09/22 04/09/22  Gloris Manchester, MD  metFORMIN (GLUCOPHAGE-XR) 500 MG 24 hr tablet Take 500 mg by mouth 2 (two) times daily with a meal. 10/06/18   [provider]  sildenafil (VIAGRA) 50 MG tablet Take 1 tablet by mouth as needed an hour before sex. Do not take more than 1 time in 48 hours. This is not a daily medicine. 07/05/20     TRESIBA FLEXTOUCH 100 UNIT/ML FlexTouch Pen Inject into the skin.    [provider]    Family History Family History  Problem Relation Age of Onset   Diabetes  Mother    Diabetes Father    Diabetes Maternal Grandmother    Diabetes Maternal Grandfather    Diabetes Paternal Grandmother    Diabetes Paternal Grandfather     Social History Social History   Tobacco Use   Smoking status: Never   Smokeless tobacco: Never  Vaping Use   Vaping Use: Never used  Substance Use Topics   Alcohol use: No   Drug use: No     Allergies   Patient has no known allergies.   Review of Systems Review of Systems Per HPI  Physical Exam Triage Vital Signs ED Triage Vitals  Enc Vitals Group     BP 08/29/22 1530 122/77     Pulse Rate 08/29/22 1530 85     Resp 08/29/22 1530 15     Temp  08/29/22 1530 98.3 F (36.8 C)     Temp Source 08/29/22 1530 Oral     SpO2 08/29/22 1530 96 %     Weight --      Height --      Head Circumference --      Peak Flow --      Pain Score 08/29/22 1533 6     Pain Loc --      Pain Edu? --      Excl. in GC? --    No data found.  Updated Vital Signs BP 122/77 (BP Location: Right Arm)   Pulse 85   Temp 98.3 F (36.8 C) (Oral)   Resp 15   SpO2 96%   Visual Acuity Right Eye Distance:   Left Eye Distance:   Bilateral Distance:    Right Eye Near:   Left Eye Near:    Bilateral Near:     Physical Exam Vitals and nursing note reviewed.  Constitutional:      General: He is not in acute distress.    Appearance: Normal appearance. He is not ill-appearing or toxic-appearing.  HENT:     Head: Normocephalic and atraumatic.     Right Ear: Tympanic membrane, ear canal and external ear normal.     Left Ear: Tympanic membrane, ear canal and external ear normal.     Nose: No congestion or rhinorrhea.     Mouth/Throat:     Mouth: Mucous membranes are moist.     Pharynx: Oropharynx is clear. No oropharyngeal exudate or posterior oropharyngeal erythema.     Tonsils: No tonsillar exudate. 1+ on the right. 1+ on the left.  Eyes:     General: No scleral icterus.    Extraocular Movements: Extraocular movements intact.  Cardiovascular:     Rate and Rhythm: Normal rate and regular rhythm.  Pulmonary:     Effort: Pulmonary effort is normal. No respiratory distress.     Breath sounds: Normal breath sounds. No wheezing, rhonchi or rales.  Musculoskeletal:     Cervical back: Normal range of motion and neck supple.  Lymphadenopathy:     Cervical: No cervical adenopathy.  Skin:    General: Skin is warm and dry.     Coloration: Skin is not jaundiced or pale.     Findings: No erythema or rash.  Neurological:     Mental Status: He is alert and oriented to person, place, and time.  Psychiatric:        Behavior: Behavior is cooperative.       UC Treatments / Results  Labs (all labs ordered are listed, but only abnormal results are displayed) Labs Reviewed  SARS CORONAVIRUS  2 (TAT 6-24 HRS)  POCT RAPID STREP A (OFFICE)    EKG   Radiology No results found.  Procedures Procedures (including critical care time)  Medications Ordered in UC Medications - No data to display  Initial Impression / Assessment and Plan / UC Course  I have reviewed the triage vital signs and the nursing notes.  Pertinent labs & imaging results that were available during my care of the patient were reviewed by me and considered in my medical decision making (see chart for details).   Patient is well-appearing, normotensive, afebrile, not tachycardic, not tachypneic, oxygenating well on room air.    1 .Viral URI 2. Encounter for screening for COVID-19 3. Acute pharyngitis, unspecified etiology Rapid strep throat test today is negative Throat culture deferred; Centor score today is -1 Suspect viral etiology COVID-19 testing obtained Patient is a candidate for molnupiravir if he tests positive Supportive care discussed ER and return precautions discussed Note given for work  The patient was given the opportunity to ask questions.  All questions answered to their satisfaction.  The patient is in agreement to this plan.    Final Clinical Impressions(s) / UC Diagnoses   Final diagnoses:  Viral URI  Encounter for screening for COVID-19  Acute pharyngitis, unspecified etiology     Discharge Instructions      You have a viral upper respiratory infection.  Symptoms should improve over the next week to 10 days.  If you develop chest pain or shortness of breath, go to the emergency room.  We have tested you today for COVID-19.  Rapid strep throat test was negative.  You will see the results in Mychart and we will call you with positive results.  Please stay home and isolate until you are aware of the results.    Some things that  can make you feel better are: - Increased rest - Increasing fluid with water/sugar free electrolytes - Acetaminophen and ibuprofen as needed for fever/pain - Salt water gargling, chloraseptic spray and throat lozenges for sore throat - OTC guaifenesin (Mucinex) 600 mg twice daily - Saline sinus flushes or a neti pot - Humidifying the air     ED Prescriptions   None    PDMP not reviewed this encounter.   Valentino Nose, NP 08/29/22 1712

## 2022-08-29 NOTE — ED Triage Notes (Signed)
Pt c/o sore throat, pt states he notice 2 white pathes on the back of his throat over the weekend gargled with warm salt water they came out with the gargle but the throat is still sore days later.

## 2022-08-29 NOTE — Discharge Instructions (Signed)
You have a viral upper respiratory infection.  Symptoms should improve over the next week to 10 days.  If you develop chest pain or shortness of breath, go to the emergency room.  We have tested you today for COVID-19.  Rapid strep throat test was negative.  You will see the results in Mychart and we will call you with positive results.  Please stay home and isolate until you are aware of the results.    Some things that can make you feel better are: - Increased rest - Increasing fluid with water/sugar free electrolytes - Acetaminophen and ibuprofen as needed for fever/pain - Salt water gargling, chloraseptic spray and throat lozenges for sore throat - OTC guaifenesin (Mucinex) 600 mg twice daily - Saline sinus flushes or a neti pot - Humidifying the air

## 2022-08-30 LAB — SARS CORONAVIRUS 2 (TAT 6-24 HRS): SARS Coronavirus 2: NEGATIVE

## 2022-09-05 NOTE — Progress Notes (Signed)
[name] [address] [place] [address] Phone: [phone] Subjective:   Zachary Mayo, am serving as a scribe for Dr. Antoine Primas.  I'm seeing this patient by the request  of:  Wilfred Curtis, MD  CC: right knee pain follow up   CZY:[id number]  Zachary Mayo is a 52 y.o. male coming in with complaint of R knee pain. Patient states that [place] the [place]. Notes having pain in knee since 52 yo. Pain throughout the entire joint. Patient has tru pull lite. Has relief when he wears brace. Uses Tylenol and Biofreeze.    MRI R knee 01/24/2020 IMPRESSION: 1. No acute findings or evidence of internal derangement. The menisci, cruciate and collateral ligaments are intact. 2. Possible mild proximal patellar tendinosis without tear. 3. Mild subcutaneous edema laterally without focal fluid collection.  Past Medical History:  Diagnosis Date   Allergy    Diabetes mellitus without complication (HCC)    type 2   Hypertension    Past Surgical History:  Procedure Laterality Date   NO PAST SURGERIES     Social History   Socioeconomic History   Marital status: Single    Spouse name: Not on file   Number of children: Not on file   Years of education: Not on file   Highest education level: Not on file  Occupational History   Not on file  Tobacco Use   Smoking status: Never   Smokeless tobacco: Never  Vaping Use   Vaping Use: Never used  Substance and Sexual Activity   Alcohol use: No   Drug use: No   Sexual activity: Never  Other Topics Concern   Not on file  Social History Narrative   Not on file   Social Determinants of Health   Financial Resource Strain: Not on file  Food Insecurity: Not on file  Transportation Needs: Not on file  Physical Activity: Not on file  Stress: Not on file  Social Connections: Not on file   No Known Allergies Family History  Problem Relation Age of Onset   Diabetes Mother     Diabetes Father    Diabetes Maternal Grandmother    Diabetes Maternal Grandfather    Diabetes Paternal Grandmother    Diabetes Paternal Grandfather     Current Outpatient Medications (Endocrine & Metabolic):    insulin lispro (HUMALOG) 100 UNIT/ML KwikPen, Inject into the skin.   TRESIBA FLEXTOUCH 100 UNIT/ML FlexTouch Pen, Inject into the skin.   Insulin Glargine, 1 Unit Dial, (TOUJEO SOLOSTAR) 300 UNIT/ML SOPN, Inject 10 Units into the skin daily.   metFORMIN (GLUCOPHAGE-XR) 500 MG 24 hr tablet, Take 500 mg by mouth 2 (two) times daily with a meal.  Current Outpatient Medications (Cardiovascular):    amLODipine (NORVASC) 2.5 MG tablet, Take by mouth.   atenolol (TENORMIN) 25 MG tablet, Take by mouth.   atorvastatin (LIPITOR) 20 MG tablet, Take 20 mg by mouth daily.   lisinopril (ZESTRIL) 10 MG tablet, Take 1 tablet (10 mg total) by mouth daily.   sildenafil (VIAGRA) 50 MG tablet, Take 1 tablet by mouth as needed an hour before sex. Do not take more than 1 time in 48 hours. This is not a daily medicine.  Current Outpatient Medications (Respiratory):    cetirizine (ZYRTEC) 10 MG tablet, Take 10 mg by mouth daily as needed for allergies.   ipratropium (ATROVENT) 0.03 % nasal spray, Place 2 sprays into both nostrils 2 (two)  times daily.    Current Outpatient Medications (Other):    diclofenac Sodium (VOLTAREN) 1 % GEL, Apply 4 g topically 4 (four) times daily as needed.   Reviewed prior external information including notes and imaging from  primary care provider As well as notes that were available from care everywhere and other healthcare systems.  Past medical history, social, surgical and family history all reviewed in electronic medical record.  No pertanent information unless stated regarding to the chief complaint.   Review of Systems:  No headache, visual changes, nausea, vomiting, diarrhea, constipation, dizziness, abdominal pain, skin rash, fevers, chills, night sweats,  weight loss, swollen lymph nodes, body aches, joint swelling, chest pain, shortness of breath, mood changes. POSITIVE muscle aches  Objective  Blood pressure (!) 150/102, pulse 99, height 5\' 9"  (1.753 m), weight 152 lb (68.9 kg), SpO2 97 %.   General: No apparent distress alert and oriented x3 mood and affect normal, dressed appropriately.  HEENT: Pupils equal, extraocular movements intact  Respiratory: Patient's speak in full sentences and does not appear short of breath  Right knee exam does have some patella alta noted.  Mild crepitus noted.  No instability of the patella noted at this time.  Tender to palpation over the patellar tendon  Limited muscular skeletal ultrasound was performed and interpreted by Antoine Primas, M    Limited ultrasound shows some degenerative changes noted of the patellofemoral joint.  Patient does have degenerative changes of the medial meniscus.  Patient also has some spurring noted at the superior patella at the patellofemoral joint.  Mild infrapatellar bursitis noted as well. Impression: As above      Impression and Recommendations:     The above documentation has been reviewed and is accurate and complete Zachary Saa, DO

## 2022-09-07 ENCOUNTER — Ambulatory Visit: Payer: Managed Care, Other (non HMO) | Admitting: Family Medicine

## 2022-09-07 ENCOUNTER — Other Ambulatory Visit: Payer: Self-pay

## 2022-09-07 ENCOUNTER — Ambulatory Visit: Payer: Managed Care, Other (non HMO)

## 2022-09-07 ENCOUNTER — Encounter: Payer: Self-pay | Admitting: Family Medicine

## 2022-09-07 VITALS — BP 150/102 | HR 99 | Ht 69.0 in | Wt 152.0 lb

## 2022-09-07 DIAGNOSIS — Q682 Congenital deformity of knee: Secondary | ICD-10-CM

## 2022-09-07 DIAGNOSIS — M2550 Pain in unspecified joint: Secondary | ICD-10-CM

## 2022-09-07 DIAGNOSIS — M25561 Pain in right knee: Secondary | ICD-10-CM

## 2022-09-07 NOTE — Assessment & Plan Note (Signed)
Patient does have more of a patella alta.  On ultrasound does have some spurring noted of the superior aspect of the patella.  I do think that this is causing potentially some impingement.  Discussed with patient that I do think that shockwave therapy could be beneficial.  Patient is well as significant other do think this is a good plan and will schedule.  We discussed patient about a patellar strap to augment the patella alta and see if this will be beneficial as well.  Discussed which activities to potentially avoid.  Follow-up with me again 6 to 8 weeks otherwise.  If worsening pain will consider injection.

## 2022-09-07 NOTE — Patient Instructions (Addendum)
Xray today Shockwave therapy  Cho-pat strap Exercises Doppler [facility]  ([facility] [employer] [facility] [address] [place], [place] [address] [phone] Labs today See me again in 6-8 weeks

## 2022-09-08 ENCOUNTER — Encounter: Payer: Self-pay | Admitting: Family Medicine

## 2022-09-08 ENCOUNTER — Telehealth: Payer: Self-pay

## 2022-09-08 LAB — D-DIMER, QUANTITATIVE: D-Dimer, Quant: 0.86 mcg/mL FEU — ABNORMAL HIGH (ref <0.50)

## 2022-09-19 ENCOUNTER — Telehealth: Payer: Self-pay | Admitting: Family Medicine

## 2022-09-19 ENCOUNTER — Ambulatory Visit (HOSPITAL_COMMUNITY)
Admission: RE | Admit: 2022-09-19 | Discharge: 2022-09-19 | Disposition: A | Discharge status: Home / Self Care | Payer: Managed Care, Other (non HMO) | Source: Ambulatory Visit | Attending: Cardiology | Admitting: Cardiology

## 2022-09-19 DIAGNOSIS — M25561 Pain in right knee: Secondary | ICD-10-CM | POA: Diagnosis present

## 2022-09-19 NOTE — Telephone Encounter (Signed)
Deanna Artis from [facility] called requesting clarification for the VAS [place] Lower Extremity Venous (DVT) that was ordered by Dr Katrinka Blazing. The order was placed stating bilateral but the patient has only experienced and was seen for right knee pain.  After reviewing with Luanna Cole, we advised for imaging to proceed with only right leg.

## 2022-09-20 ENCOUNTER — Ambulatory Visit: Payer: Self-pay | Admitting: Sports Medicine

## 2022-09-20 DIAGNOSIS — M25561 Pain in right knee: Secondary | ICD-10-CM

## 2022-09-20 DIAGNOSIS — Q682 Congenital deformity of knee: Secondary | ICD-10-CM

## 2022-09-20 NOTE — Progress Notes (Signed)
   Zachary Mayo [facility] [address] [place] [address] Phone: [phone]   Extracorporeal Shockwave Therapy Note    Patient is being treated today with ECSWT. Informed consent was obtained and patient tolerated procedure well.   Therapy performed by Zachary Mayo  Condition treated: Patellar tendinitis and quadriceps tendinitis Treatment preset used: Chronic patellar tendinitis Energy used: 120 mJ Frequency used: 16 hz Number of pulses: 3000 Head Size: Medium Treatment #1 of #3  Electronically signed by:  [facility] 1:38 PM 09/20/22

## 2022-09-27 ENCOUNTER — Ambulatory Visit: Payer: Self-pay | Admitting: Sports Medicine

## 2022-09-27 DIAGNOSIS — M25561 Pain in right knee: Secondary | ICD-10-CM

## 2022-09-27 DIAGNOSIS — Q682 Congenital deformity of knee: Secondary | ICD-10-CM

## 2022-09-27 NOTE — Progress Notes (Signed)
   Richardean Sale [facility] [address] [place] [address] Phone: [phone]   Extracorporeal Shockwave Therapy Note    Patient is being treated today with ECSWT. Informed consent was obtained and patient tolerated procedure well.   Therapy performed by Richardean Sale  Condition treated: Patellar tendinitis and quadriceps tendinitis Treatment preset used: Chronic patellar tendinitis Energy used: 120 mJ Frequency used: 16 hz Number of pulses: 3000 Head Size: Medium Treatment #2 of #3  Electronically signed by:  Marcelline Mates [facility] 1:49 PM 09/27/22

## 2022-10-04 ENCOUNTER — Ambulatory Visit (INDEPENDENT_AMBULATORY_CARE_PROVIDER_SITE_OTHER): Payer: Self-pay | Admitting: Sports Medicine

## 2022-10-04 DIAGNOSIS — Q682 Congenital deformity of knee: Secondary | ICD-10-CM

## 2022-10-04 DIAGNOSIS — M25561 Pain in right knee: Secondary | ICD-10-CM

## 2022-10-04 NOTE — Progress Notes (Signed)
   Richardean Sale [facility] [address] [place] [address] Phone: [phone]   Extracorporeal Shockwave Therapy Note    Patient is being treated today with ECSWT. Informed consent was obtained and patient tolerated procedure well.   Therapy performed by Richardean Sale  Condition treated: Patellar tendinitis and quadriceps tendinitis Treatment preset used: Chronic patellar tendinitis Energy used: 120 mJ Frequency used: 16 hz Number of pulses: 3000 Head Size: Medium Treatment #3 of #3  Electronically signed by:  Marcelline Mates Sports [facility] 2:19 PM 10/04/22

## 2022-10-17 NOTE — Progress Notes (Deleted)
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  On ultrasound does have some spurring noted of the superior aspect of the patella.  I do think that this is causing potentially some impingement.  Discussed with patient that I do think that shockwave therapy could be beneficial.  Patient is well as significant other do think this is a good plan and will schedule.  We discussed patient about a patellar strap to augment the patella alta and see if this will be beneficial as well.  Discussed which activities to potentially avoid.  Follow-up with me again 6 to 8 weeks otherwise.  If worsening pain will consider injection.     Updated 10/19/2022 Zachary Mayo is a 52 y.o. male coming in with complaint of R knee pain. Has been seeing Dr. Jean Rosenthal       Past Medical History:  Diagnosis Date   Allergy    Diabetes mellitus without complication (HCC)    type 2   Hypertension    Past Surgical History:  Procedure Laterality Date   NO PAST SURGERIES     Social History   Socioeconomic History   Marital status: Single    Spouse name: Not on file   Number of children: Not on file   Years of education: Not on file   Highest education level: Not on file  Occupational History   Not on file  Tobacco Use   Smoking status: Never   Smokeless tobacco: Never  Vaping Use   Vaping status: Never Used  Substance and Sexual Activity   Alcohol use: No   Drug use: No   Sexual activity: Never  Other Topics Concern   Not on file  Social History Narrative   Not on file   Social Determinants of Health   Financial Resource Strain: Low Risk  (06/11/2022)   Received from [facility], [facility]   Overall Financial Resource Strain (CARDIA)    Difficulty of Paying Living Expenses: Not hard at  all  Food Insecurity: No Food Insecurity (06/11/2022)   Received from [facility], [facility]   Hunger Vital Sign    Worried About Running Out of Food in the Last Year: Never true    Ran Out of Food in the Last Year: Never true  Transportation Needs: No Transportation Needs (06/11/2022)   Received from [facility], [facility]   PRAPARE - Transportation    Lack of Transportation (Medical): No    Lack of Transportation (Non-Medical): No  Physical Activity: Sufficiently Active (06/11/2022)   Received from [facility], [facility]   Exercise Vital Sign    Days of Exercise per Week: 5 days    Minutes of Exercise per Session: 30 min  Stress: No Stress Concern Present (06/11/2022)   Received from [facility] of Occupational Health - Occupational Stress Questionnaire    Feeling of Stress : Not at all  Social Connections: Socially Integrated (06/11/2022)   Received from [facility], [facility]   Social Network    How would you rate your social network (family, work, friends)?: Good participation with social networks   No Known Allergies Family History  Problem Relation Age of Onset   Diabetes Mother    Diabetes Father  Diabetes Maternal Grandmother    Diabetes Maternal Grandfather    Diabetes Paternal Grandmother    Diabetes Paternal Grandfather     Current Outpatient Medications (Endocrine & Metabolic):    Insulin Glargine, 1 Unit Dial, (TOUJEO SOLOSTAR) 300 UNIT/ML SOPN, Inject 10 Units into the skin daily.   insulin lispro (HUMALOG) 100 UNIT/ML KwikPen, Inject into the skin.   metFORMIN (GLUCOPHAGE-XR) 500 MG 24 hr tablet, Take 500 mg by mouth 2 (two) times daily with a meal.   TRESIBA FLEXTOUCH 100 UNIT/ML FlexTouch Pen, Inject into the skin.  Current Outpatient Medications (Cardiovascular):    amLODipine (NORVASC) 2.5 MG tablet, Take by mouth.   atenolol (TENORMIN) 25 MG tablet, Take by mouth.   atorvastatin (LIPITOR) 20 MG  tablet, Take 20 mg by mouth daily.   lisinopril (ZESTRIL) 10 MG tablet, Take 1 tablet (10 mg total) by mouth daily.   sildenafil (VIAGRA) 50 MG tablet, Take 1 tablet by mouth as needed an hour before sex. Do not take more than 1 time in 48 hours. This is not a daily medicine.  Current Outpatient Medications (Respiratory):    cetirizine (ZYRTEC) 10 MG tablet, Take 10 mg by mouth daily as needed for allergies.   ipratropium (ATROVENT) 0.03 % nasal spray, Place 2 sprays into both nostrils 2 (two) times daily.    Current Outpatient Medications (Other):    diclofenac Sodium (VOLTAREN) 1 % GEL, Apply 4 g topically 4 (four) times daily as needed.   Reviewed prior external information including notes and imaging from  primary care provider As well as notes that were available from care everywhere and other healthcare systems.  Past medical history, social, surgical and family history all reviewed in electronic medical record.  No pertanent information unless stated regarding to the chief complaint.   Review of Systems:  No headache, visual changes, nausea, vomiting, diarrhea, constipation, dizziness, abdominal pain, skin rash, fevers, chills, night sweats, weight loss, swollen lymph nodes, body aches, joint swelling, chest pain, shortness of breath, mood changes. POSITIVE muscle aches  Objective  There were no vitals taken for this visit.   General: No apparent distress alert and oriented x3 mood and affect normal, dressed appropriately.  HEENT: Pupils equal, extraocular movements intact  Respiratory: Patient's speak in full sentences and does not appear short of breath  Cardiovascular: No lower extremity edema, non tender, no erythema      Impression and Recommendations:

## 2022-10-19 ENCOUNTER — Ambulatory Visit: Payer: Managed Care, Other (non HMO) | Admitting: Family Medicine

## 2022-10-25 ENCOUNTER — Other Ambulatory Visit (HOSPITAL_BASED_OUTPATIENT_CLINIC_OR_DEPARTMENT_OTHER): Payer: Self-pay

## 2022-11-22 NOTE — Progress Notes (Unsigned)
[facility] 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  On ultrasound does have some spurring noted of the superior aspect of the patella.  I do think that this is causing potentially some impingement.  Discussed with patient that I do think that shockwave therapy could be beneficial.  Patient is well as significant other do think this is a good plan and will schedule.  We discussed patient about a patellar strap to augment the patella alta and see if this will be beneficial as well.  Discussed which activities to potentially avoid.  Follow-up with me again 6 to 8 weeks otherwise.  If worsening pain will consider injection.     Updated 11/23/2022 Zachary Mayo is a 52 y.o. male coming in with complaint of knee pain. Saw Dr. Jean Rosenthal in July. Knee hurts a little but that's because he is on his feet for 12 hours. Bone spurs have resolved  Patient states that overall is feeling significantly better.      Past Medical History:  Diagnosis Date   Allergy    Diabetes mellitus without complication (HCC)    type 2   Hypertension    Past Surgical History:  Procedure Laterality Date   NO PAST SURGERIES     Social History   Socioeconomic History   Marital status: Single    Spouse name: Not on file   Number of children: Not on file   Years of education: Not on file   Highest education level: Not on file  Occupational History   Not on file  Tobacco Use   Smoking status: Never   Smokeless tobacco: Never  Vaping Use   Vaping status: Never Used  Substance and Sexual Activity   Alcohol use: No   Drug use: No   Sexual activity: Never  Other Topics Concern   Not on file  Social History Narrative   Not on file   Social Determinants of Health   Financial Resource  Strain: Low Risk  (06/11/2022)   Received from [facility], [facility]   Overall Financial Resource Strain (CARDIA)    Difficulty of Paying Living Expenses: Not hard at all  Food Insecurity: No Food Insecurity (06/11/2022)   Received from [facility], [facility]   Hunger Vital Sign    Worried About Running Out of Food in the Last Year: Never true    Ran Out of Food in the Last Year: Never true  Transportation Needs: No Transportation Needs (06/11/2022)   Received from [facility], [facility]   PRAPARE - Transportation    Lack of Transportation (Medical): No    Lack of Transportation (Non-Medical): No  Physical Activity: Sufficiently Active (06/11/2022)   Received from [facility], [facility]   Exercise Vital Sign    Days of Exercise per Week: 5 days    Minutes of Exercise per Session: 30 min  Stress: No Stress Concern Present (06/11/2022)   Received from [facility] of Occupational Health - Occupational Stress Questionnaire    Feeling of Stress : Not at all  Social Connections: Socially Integrated (06/11/2022)   [facility], [facility]   Social Network    How would you rate your social network (family, work, friends)?: Good  participation with social networks   No Known Allergies Family History  Problem Relation Age of Onset   Diabetes Mother    Diabetes Father    Diabetes Maternal Grandmother    Diabetes Maternal Grandfather    Diabetes Paternal Grandmother    Diabetes Paternal Grandfather     Current Outpatient Medications (Endocrine & Metabolic):    insulin lispro (HUMALOG) 100 UNIT/ML KwikPen, Inject into the skin.   TRESIBA FLEXTOUCH 100 UNIT/ML FlexTouch Pen, Inject into the skin.   Insulin Glargine, 1 Unit Dial, (TOUJEO SOLOSTAR) 300 UNIT/ML SOPN, Inject 10 Units into the skin daily.   metFORMIN (GLUCOPHAGE-XR) 500 MG 24 hr tablet, Take 500 mg by mouth 2 (two) times daily with a meal.  Current  Outpatient Medications (Cardiovascular):    amLODipine (NORVASC) 2.5 MG tablet, Take by mouth.   atenolol (TENORMIN) 25 MG tablet, Take by mouth.   atorvastatin (LIPITOR) 20 MG tablet, Take 20 mg by mouth daily.   lisinopril (ZESTRIL) 10 MG tablet, Take 1 tablet (10 mg total) by mouth daily.   sildenafil (VIAGRA) 50 MG tablet, Take 1 tablet by mouth as needed an hour before sex. Do not take more than 1 time in 48 hours. This is not a daily medicine.  Current Outpatient Medications (Respiratory):    cetirizine (ZYRTEC) 10 MG tablet, Take 10 mg by mouth daily as needed for allergies.   ipratropium (ATROVENT) 0.03 % nasal spray, Place 2 sprays into both nostrils 2 (two) times daily.    Current Outpatient Medications (Other):    diclofenac Sodium (VOLTAREN) 1 % GEL, Apply 4 g topically 4 (four) times daily as needed.   Reviewed prior external information including notes and imaging from  primary care provider As well as notes that were available from care everywhere and other healthcare systems.  Past medical history, social, surgical and family history all reviewed in electronic medical record.  No pertanent information unless stated regarding to the chief complaint.   Review of Systems:  No headache, visual changes, nausea, vomiting, diarrhea, constipation, dizziness, abdominal pain, skin rash, fevers, chills, night sweats, weight loss, swollen lymph nodes, body aches, joint swelling, chest pain, shortness of breath, mood changes. POSITIVE muscle aches  Objective  Blood pressure 138/80, pulse 76, height 5\' 9"  (1.753 m), weight 152 lb (68.9 kg), SpO2 97%.   General: No apparent distress alert and oriented x3 mood and affect normal, dressed appropriately.  HEENT: Pupils equal, extraocular movements intact  Respiratory: Patient's speak in full sentences and does not appear short of breath  Cardiovascular: No lower extremity edema, non tender, no erythema  Atrophy of the quadricep muscle  noted on the side but does have it on the contralateral side as well.  Good range of motion noted, no tenderness on exam today.  Limited muscular skeletal ultrasound was performed and interpreted by Antoine Primas, M   Limited ultrasound shows significant decrease in the calcific changes noted of the quadricep tendon previously.  No significant bone spurring noted even at the moment.    Impression and Recommendations:    The above documentation has been reviewed and is accurate and complete Judi Saa, DO

## 2022-11-23 ENCOUNTER — Encounter: Payer: Self-pay | Admitting: Family Medicine

## 2022-11-23 ENCOUNTER — Other Ambulatory Visit: Payer: Self-pay

## 2022-11-23 ENCOUNTER — Ambulatory Visit: Payer: Managed Care, Other (non HMO) | Admitting: Family Medicine

## 2022-11-23 VITALS — BP 138/80 | HR 76 | Ht 69.0 in | Wt 152.0 lb

## 2022-11-23 DIAGNOSIS — Q682 Congenital deformity of knee: Secondary | ICD-10-CM | POA: Diagnosis not present

## 2022-11-23 DIAGNOSIS — M25561 Pain in right knee: Secondary | ICD-10-CM | POA: Diagnosis not present

## 2022-11-23 NOTE — Assessment & Plan Note (Signed)
Calcific aspect and spurring of the patella altered seems to be resolved somewhat.  Still has some atrophy noted but it could be secondary to the dyslipidemia as well as the type 2 diabetes.  We discussed if worsening symptoms or worsening discomfort that did come back but otherwise patient wants to continue with conservative therapy.  Patient will continue with the patella strap on a more as needed basis.  Follow-up with me as well as needed

## 2022-12-13 IMAGING — DX DG ANKLE COMPLETE 3+V*R*
3 series · 3 of 3 positions shown · non-contrast
Comparison: None Available.

CLINICAL DATA: Right ankle pain

EXAM:
RIGHT ANKLE - COMPLETE 3+ VIEW

[ankle ap]
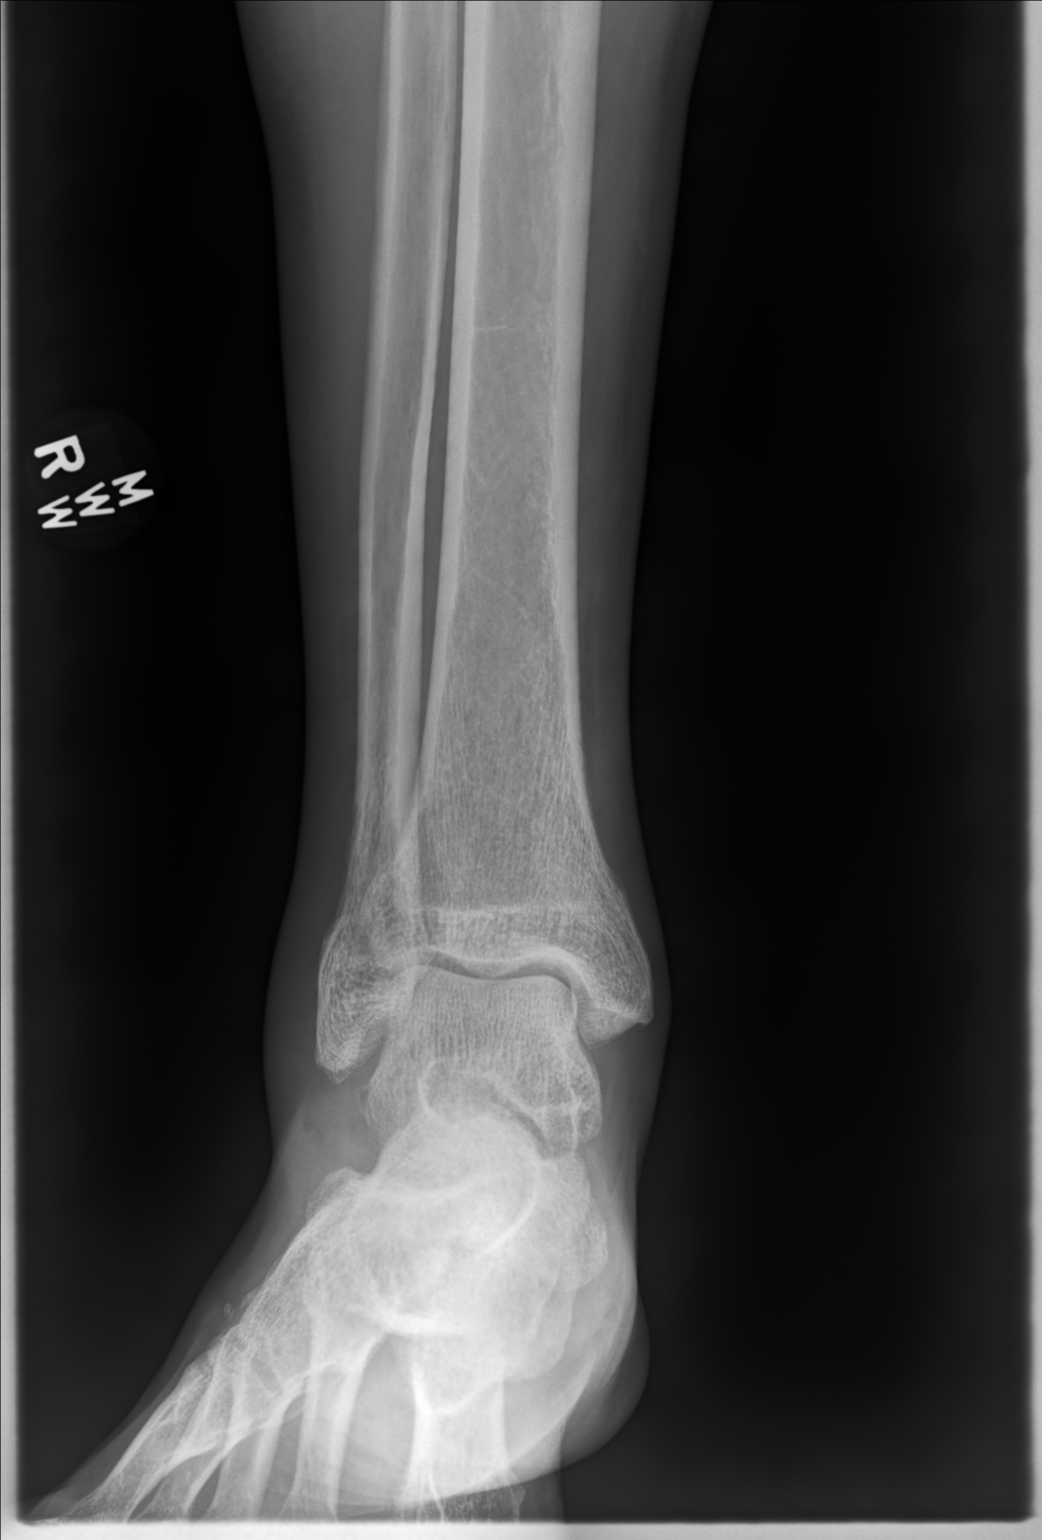

[ankle mlo]
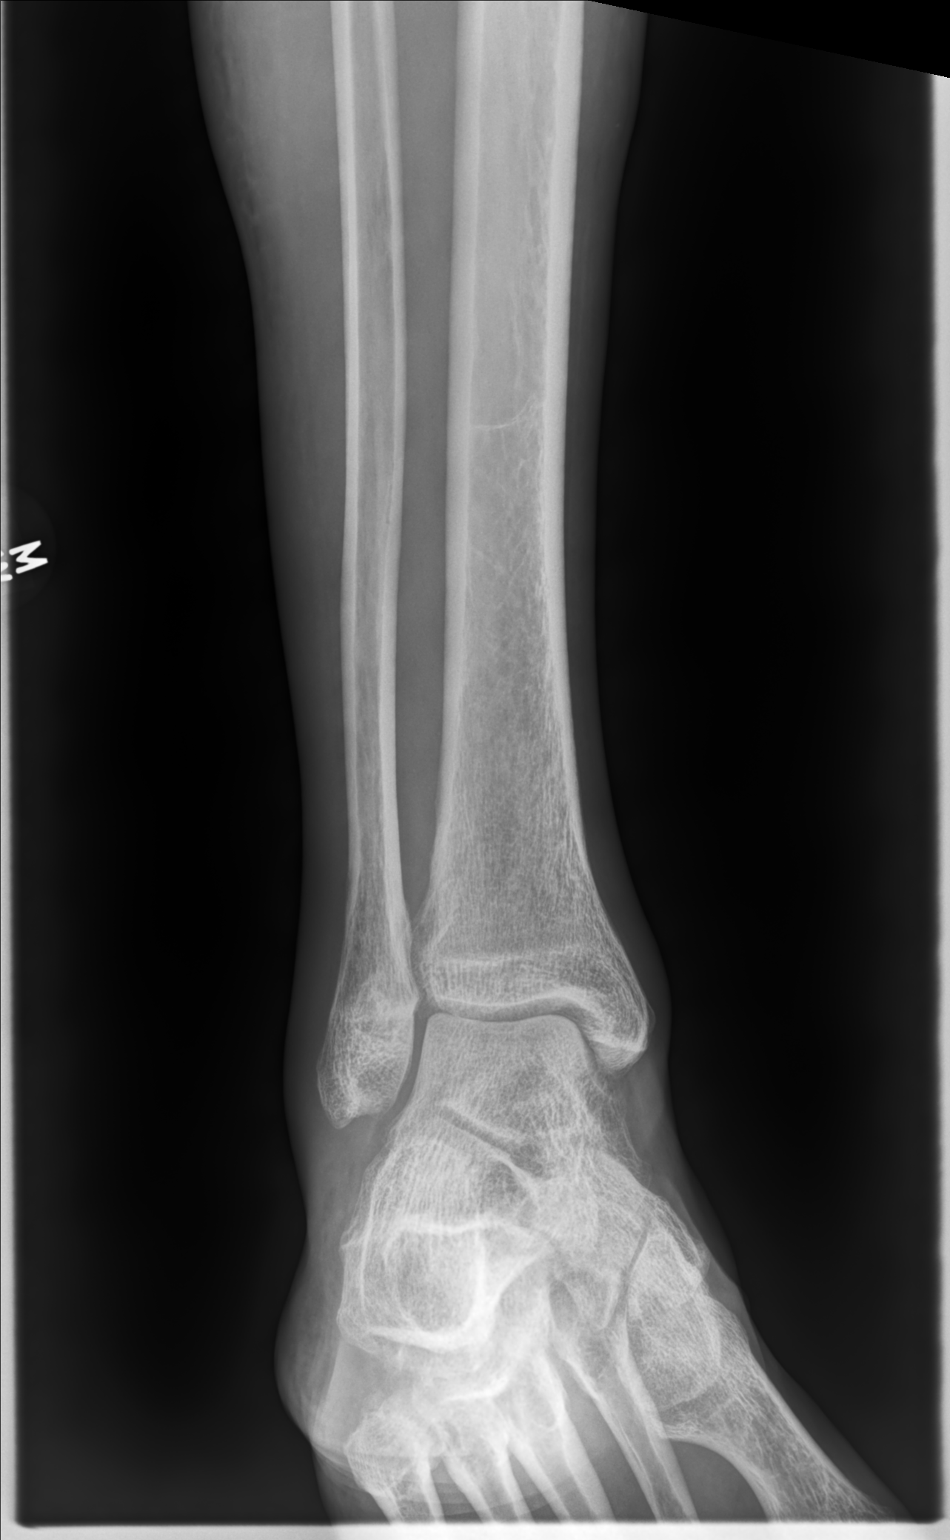

[ankle lat]
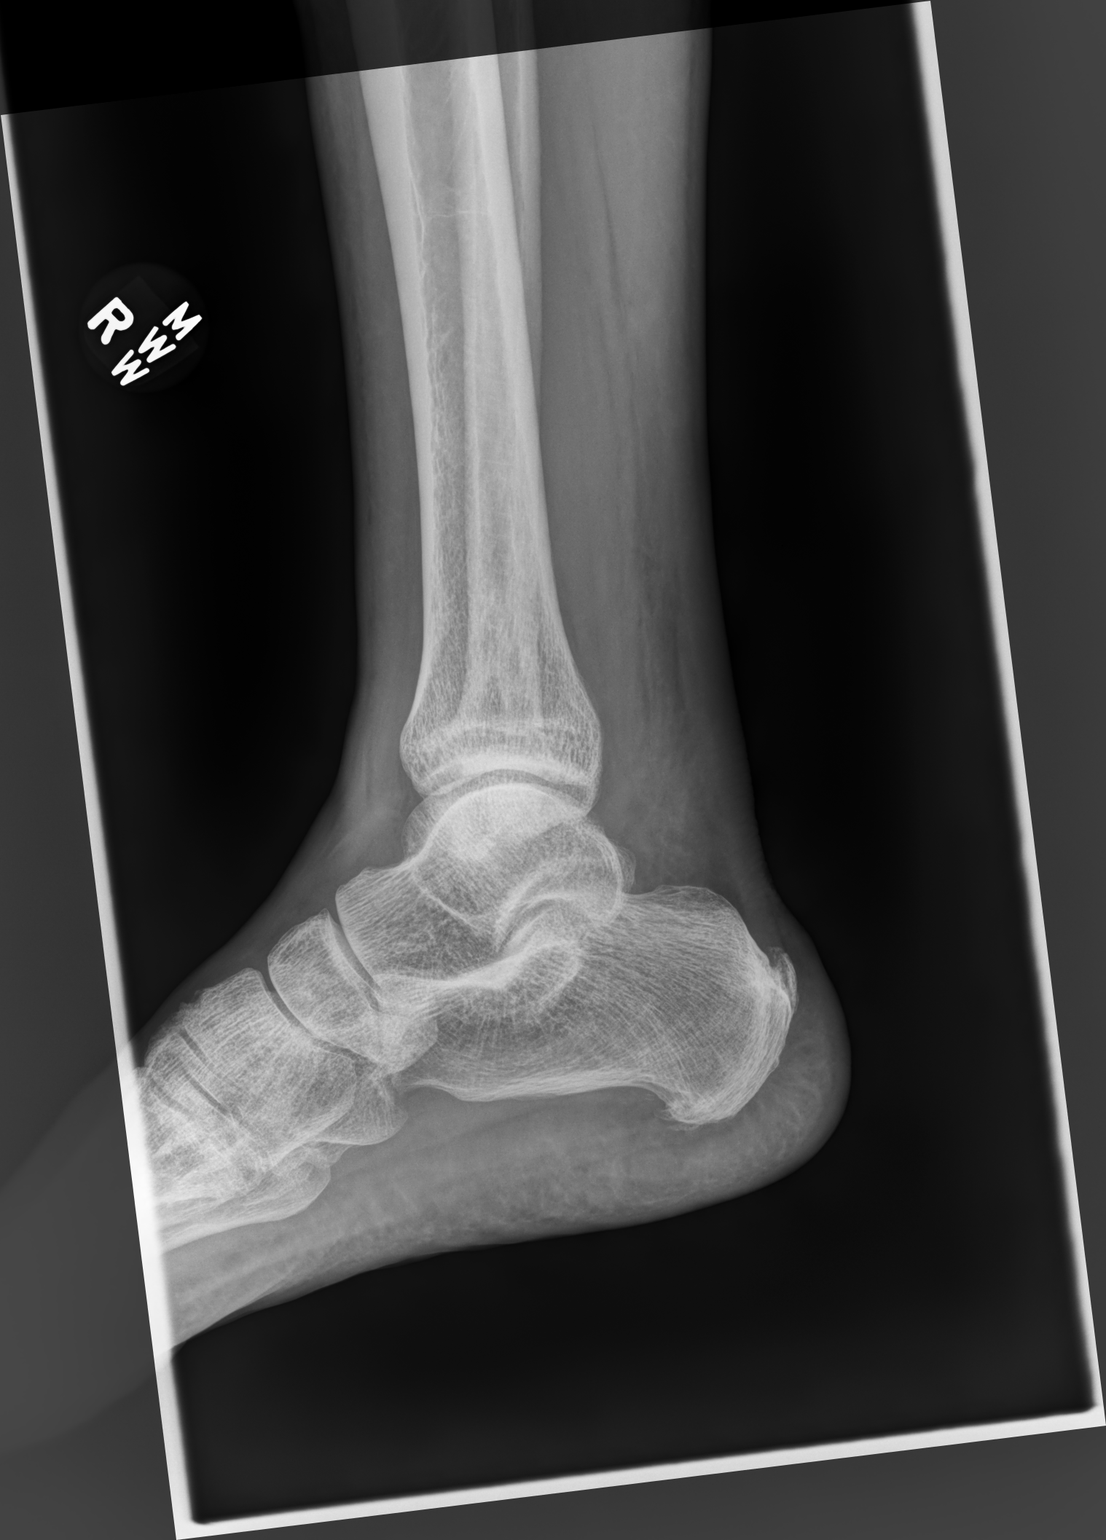

[3 of 3 positions shown; findings below may reference images not displayed]

FINDINGS: No fracture or dislocation is seen.

The ankle mortise is intact.

Mild lateral soft tissue swelling.
IMPRESSION: Negative.

## 2023-01-24 NOTE — Progress Notes (Unsigned)
    Aleen Sells D.[facility] [address] [place] [address] Phone: [phone]   Assessment and Plan:     There are no diagnoses linked to this encounter.  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  On ultrasound does have some spurring noted of the superior aspect of the patella.  I do think that this is causing potentially some impingement.  Discussed with patient that I do think that shockwave therapy could be beneficial.  Patient is well as significant other do think this is a good plan and will schedule.  We discussed patient about a patellar strap to augment the patella alta and see if this will be beneficial as well.  Discussed which activities to potentially avoid.  Follow-up with me again 6 to 8 weeks otherwise.  If worsening pain will consider injection.    Updated 11/23/2022 Zachary Mayo is a 52 y.o. male coming in with complaint of knee pain. Saw Dr. Jean Rosenthal in July. Knee hurts a little but that's because he is on his feet for 12 hours. Bone spurs have resolved  Patient states that overall is feeling significantly better.  01/25/2023 Patient states   Relevant Historical Information: ***  Additional pertinent review of systems negative.   Current Outpatient Medications:    amLODipine (NORVASC) 2.5 MG tablet, Take by mouth., Disp: , Rfl:    atenolol (TENORMIN) 25 MG tablet, Take by mouth., Disp: , Rfl:    atorvastatin (LIPITOR) 20 MG tablet, Take 20 mg by mouth daily., Disp: , Rfl:    cetirizine (ZYRTEC) 10 MG tablet, Take 10 mg by mouth daily as needed for allergies., Disp: , Rfl:    diclofenac Sodium (VOLTAREN) 1 % GEL, Apply 4 g topically 4 (four) times daily as needed., Disp: 500 g, Rfl: 6   Insulin  Glargine, 1 Unit Dial, (TOUJEO SOLOSTAR) 300 UNIT/ML SOPN, Inject 10 Units into the skin daily., Disp: , Rfl:    insulin lispro (HUMALOG) 100 UNIT/ML KwikPen, Inject into the skin., Disp: , Rfl:    ipratropium (ATROVENT) 0.03 % nasal spray, Place 2 sprays into both nostrils 2 (two) times daily., Disp: 30 mL, Rfl: 0   lisinopril (ZESTRIL) 10 MG tablet, Take 1 tablet (10 mg total) by mouth daily., Disp: 30 tablet, Rfl: 2   metFORMIN (GLUCOPHAGE-XR) 500 MG 24 hr tablet, Take 500 mg by mouth 2 (two) times daily with a meal., Disp: , Rfl:    sildenafil (VIAGRA) 50 MG tablet, Take 1 tablet by mouth as needed an hour before sex. Do not take more than 1 time in 48 hours. This is not a daily medicine., Disp: 30 tablet, Rfl: 1   TRESIBA FLEXTOUCH 100 UNIT/ML FlexTouch Pen, Inject into the skin., Disp: , Rfl:    Objective:     There were no vitals filed for this visit.    There is no height or weight on file to calculate BMI.    Physical Exam:    ***   Electronically signed by:  Aleen Sells D.[facility] 4:33 PM 01/24/23

## 2023-01-25 ENCOUNTER — Ambulatory Visit: Payer: BC Managed Care – PPO | Admitting: Sports Medicine

## 2023-01-25 ENCOUNTER — Ambulatory Visit (INDEPENDENT_AMBULATORY_CARE_PROVIDER_SITE_OTHER): Payer: BC Managed Care – PPO

## 2023-01-25 VITALS — BP 120/82 | HR 84 | Ht 69.0 in | Wt 158.0 lb

## 2023-01-25 DIAGNOSIS — M25572 Pain in left ankle and joints of left foot: Secondary | ICD-10-CM

## 2023-01-25 DIAGNOSIS — M25562 Pain in left knee: Secondary | ICD-10-CM | POA: Diagnosis not present

## 2023-01-25 MED ORDER — MELOXICAM 15 MG PO TABS: 15.0000 mg | ORAL_TABLET | Freq: Every day | ORAL | 0 refills | Status: AC

## 2023-01-25 NOTE — Patient Instructions (Signed)
-   Start meloxicam 15 mg daily x2 weeks.  If still having pain after 2 weeks, complete 3rd-week of meloxicam. May use remaining meloxicam as needed once daily for pain control.  Do not to use additional NSAIDs while taking meloxicam.  May use Tylenol [phone] mg 2 to 3 times a day for breakthrough pain. Ankle and knee HEP  3 week follow up

## 2023-10-17 NOTE — Telephone Encounter (Signed)
 Error
